# Patient Record
Sex: Male | Born: 1950 | Race: White | Hispanic: No | Marital: Married | Smoking: Never smoker
Health system: Southern US, Community
[De-identification: ages and names within clinical notes are randomized; demographics above are authoritative.]

## PROBLEM LIST (undated history)

## (undated) DIAGNOSIS — I10 Essential (primary) hypertension: Secondary | ICD-10-CM

## (undated) DIAGNOSIS — N183 Chronic kidney disease, stage 3 unspecified: Secondary | ICD-10-CM

## (undated) DIAGNOSIS — E119 Type 2 diabetes mellitus without complications: Secondary | ICD-10-CM

## (undated) DIAGNOSIS — D649 Anemia, unspecified: Secondary | ICD-10-CM

## (undated) DIAGNOSIS — E785 Hyperlipidemia, unspecified: Secondary | ICD-10-CM

## (undated) HISTORY — PX: PROSTATE BIOPSY: SHX241

## (undated) HISTORY — DX: Chronic kidney disease, stage 3 unspecified: N18.30

## (undated) HISTORY — DX: Anemia, unspecified: D64.9

## (undated) HISTORY — DX: Essential (primary) hypertension: I10

## (undated) HISTORY — DX: Type 2 diabetes mellitus without complications: E11.9

## (undated) HISTORY — PX: OTHER SURGICAL HISTORY: SHX169

## (undated) HISTORY — DX: Hyperlipidemia, unspecified: E78.5

---

## 2016-06-18 DIAGNOSIS — K219 Gastro-esophageal reflux disease without esophagitis: Secondary | ICD-10-CM | POA: Diagnosis not present

## 2016-06-18 DIAGNOSIS — I10 Essential (primary) hypertension: Secondary | ICD-10-CM | POA: Diagnosis not present

## 2016-06-18 DIAGNOSIS — Z23 Encounter for immunization: Secondary | ICD-10-CM | POA: Diagnosis not present

## 2016-06-18 DIAGNOSIS — E119 Type 2 diabetes mellitus without complications: Secondary | ICD-10-CM | POA: Diagnosis not present

## 2016-06-18 DIAGNOSIS — E7800 Pure hypercholesterolemia, unspecified: Secondary | ICD-10-CM | POA: Diagnosis not present

## 2016-10-22 ENCOUNTER — Other Ambulatory Visit (HOSPITAL_COMMUNITY): Payer: Self-pay | Admitting: Ophthalmology

## 2016-10-22 DIAGNOSIS — I6523 Occlusion and stenosis of bilateral carotid arteries: Secondary | ICD-10-CM

## 2016-11-05 ENCOUNTER — Ambulatory Visit (HOSPITAL_COMMUNITY): Admission: RE | Admit: 2016-11-05 | Payer: Self-pay | Source: Ambulatory Visit

## 2016-11-26 ENCOUNTER — Ambulatory Visit (HOSPITAL_COMMUNITY)
Admission: RE | Admit: 2016-11-26 | Discharge: 2016-11-26 | Disposition: A | Discharge status: Home / Self Care | Payer: Medicare Other | Source: Ambulatory Visit | Attending: Cardiology | Admitting: Cardiology

## 2016-11-26 DIAGNOSIS — E119 Type 2 diabetes mellitus without complications: Secondary | ICD-10-CM | POA: Diagnosis not present

## 2016-11-26 DIAGNOSIS — I10 Essential (primary) hypertension: Secondary | ICD-10-CM | POA: Insufficient documentation

## 2016-11-26 DIAGNOSIS — E785 Hyperlipidemia, unspecified: Secondary | ICD-10-CM | POA: Insufficient documentation

## 2016-11-26 DIAGNOSIS — I6523 Occlusion and stenosis of bilateral carotid arteries: Secondary | ICD-10-CM

## 2017-10-15 ENCOUNTER — Ambulatory Visit
Admission: RE | Admit: 2017-10-15 | Discharge: 2017-10-15 | Disposition: A | Discharge status: Home / Self Care | Payer: Medicare Other | Source: Ambulatory Visit | Attending: Internal Medicine | Admitting: Internal Medicine

## 2017-10-15 ENCOUNTER — Other Ambulatory Visit: Payer: Self-pay | Admitting: Internal Medicine

## 2017-10-15 DIAGNOSIS — M25562 Pain in left knee: Secondary | ICD-10-CM

## 2019-02-24 ENCOUNTER — Other Ambulatory Visit: Payer: Self-pay | Admitting: Urology

## 2019-02-24 DIAGNOSIS — C61 Malignant neoplasm of prostate: Secondary | ICD-10-CM

## 2019-06-24 ENCOUNTER — Other Ambulatory Visit: Payer: Medicare Other

## 2019-07-13 ENCOUNTER — Other Ambulatory Visit: Payer: Self-pay

## 2019-07-13 ENCOUNTER — Ambulatory Visit
Admission: RE | Admit: 2019-07-13 | Discharge: 2019-07-13 | Disposition: A | Discharge status: Home / Self Care | Payer: Medicare Other | Source: Ambulatory Visit | Attending: Urology | Admitting: Urology

## 2019-07-13 DIAGNOSIS — C61 Malignant neoplasm of prostate: Secondary | ICD-10-CM

## 2019-07-13 MED ORDER — GADOBENATE DIMEGLUMINE 529 MG/ML IV SOLN
15.0000 mL | Freq: Once | INTRAVENOUS | Status: AC | PRN
  Administered 2019-07-13: 15 mL via INTRAVENOUS

## 2020-03-11 ENCOUNTER — Other Ambulatory Visit: Payer: Self-pay | Admitting: Internal Medicine

## 2020-03-11 DIAGNOSIS — N183 Chronic kidney disease, stage 3 unspecified: Secondary | ICD-10-CM

## 2020-03-16 ENCOUNTER — Other Ambulatory Visit: Payer: Medicare Other

## 2020-03-21 ENCOUNTER — Ambulatory Visit
Admission: RE | Admit: 2020-03-21 | Discharge: 2020-03-21 | Disposition: A | Discharge status: Home / Self Care | Payer: Medicare Other | Source: Ambulatory Visit | Attending: Internal Medicine | Admitting: Internal Medicine

## 2020-03-21 DIAGNOSIS — N183 Chronic kidney disease, stage 3 unspecified: Secondary | ICD-10-CM

## 2020-09-28 DIAGNOSIS — I129 Hypertensive chronic kidney disease with stage 1 through stage 4 chronic kidney disease, or unspecified chronic kidney disease: Secondary | ICD-10-CM | POA: Diagnosis not present

## 2020-09-28 DIAGNOSIS — N2581 Secondary hyperparathyroidism of renal origin: Secondary | ICD-10-CM | POA: Diagnosis not present

## 2020-09-28 DIAGNOSIS — E872 Acidosis: Secondary | ICD-10-CM | POA: Diagnosis not present

## 2020-09-28 DIAGNOSIS — D631 Anemia in chronic kidney disease: Secondary | ICD-10-CM | POA: Diagnosis not present

## 2020-09-28 DIAGNOSIS — N1832 Chronic kidney disease, stage 3b: Secondary | ICD-10-CM | POA: Diagnosis not present

## 2020-09-28 DIAGNOSIS — R809 Proteinuria, unspecified: Secondary | ICD-10-CM | POA: Diagnosis not present

## 2021-01-03 DIAGNOSIS — Z7984 Long term (current) use of oral hypoglycemic drugs: Secondary | ICD-10-CM | POA: Diagnosis not present

## 2021-01-03 DIAGNOSIS — E1122 Type 2 diabetes mellitus with diabetic chronic kidney disease: Secondary | ICD-10-CM | POA: Diagnosis not present

## 2021-01-03 DIAGNOSIS — Z0000 Encounter for general adult medical examination without abnormal findings: Secondary | ICD-10-CM | POA: Diagnosis not present

## 2021-01-03 DIAGNOSIS — E782 Mixed hyperlipidemia: Secondary | ICD-10-CM | POA: Diagnosis not present

## 2021-01-03 DIAGNOSIS — Z1211 Encounter for screening for malignant neoplasm of colon: Secondary | ICD-10-CM | POA: Diagnosis not present

## 2021-01-03 DIAGNOSIS — Z1389 Encounter for screening for other disorder: Secondary | ICD-10-CM | POA: Diagnosis not present

## 2021-01-03 DIAGNOSIS — C029 Malignant neoplasm of tongue, unspecified: Secondary | ICD-10-CM | POA: Diagnosis not present

## 2021-01-03 DIAGNOSIS — M109 Gout, unspecified: Secondary | ICD-10-CM | POA: Diagnosis not present

## 2021-01-03 DIAGNOSIS — I10 Essential (primary) hypertension: Secondary | ICD-10-CM | POA: Diagnosis not present

## 2021-01-03 DIAGNOSIS — N183 Chronic kidney disease, stage 3 unspecified: Secondary | ICD-10-CM | POA: Diagnosis not present

## 2021-02-01 DIAGNOSIS — Z1211 Encounter for screening for malignant neoplasm of colon: Secondary | ICD-10-CM | POA: Diagnosis not present

## 2021-02-01 DIAGNOSIS — Z1212 Encounter for screening for malignant neoplasm of rectum: Secondary | ICD-10-CM | POA: Diagnosis not present

## 2021-02-09 LAB — COLOGUARD: COLOGUARD: NEGATIVE

## 2021-04-03 DIAGNOSIS — N1832 Chronic kidney disease, stage 3b: Secondary | ICD-10-CM | POA: Diagnosis not present

## 2021-04-05 DIAGNOSIS — R809 Proteinuria, unspecified: Secondary | ICD-10-CM | POA: Diagnosis not present

## 2021-04-05 DIAGNOSIS — I129 Hypertensive chronic kidney disease with stage 1 through stage 4 chronic kidney disease, or unspecified chronic kidney disease: Secondary | ICD-10-CM | POA: Diagnosis not present

## 2021-04-05 DIAGNOSIS — E872 Acidosis: Secondary | ICD-10-CM | POA: Diagnosis not present

## 2021-04-05 DIAGNOSIS — N2581 Secondary hyperparathyroidism of renal origin: Secondary | ICD-10-CM | POA: Diagnosis not present

## 2021-04-05 DIAGNOSIS — D631 Anemia in chronic kidney disease: Secondary | ICD-10-CM | POA: Diagnosis not present

## 2021-04-05 DIAGNOSIS — N1832 Chronic kidney disease, stage 3b: Secondary | ICD-10-CM | POA: Diagnosis not present

## 2021-04-25 ENCOUNTER — Ambulatory Visit
Admission: RE | Admit: 2021-04-25 | Discharge: 2021-04-25 | Disposition: A | Discharge status: Home / Self Care | Payer: Medicare Other | Source: Ambulatory Visit | Attending: Radiation Oncology | Admitting: Radiation Oncology

## 2021-04-25 DIAGNOSIS — C61 Malignant neoplasm of prostate: Secondary | ICD-10-CM

## 2021-04-25 HISTORY — DX: Malignant neoplasm of prostate: C61

## 2021-04-25 NOTE — Progress Notes (Signed)
GU Location of Tumor / Histology:  Adenocarcinoma of the prostate  If Prostate Cancer, Gleason Score is (4 + 3), PSA (7.4 as of 11/16/20), and Prostate volume (~43.5 g)  Kyle Rogers presented with signs/symptoms of: (from Dr. Ralene Muskrat note on 04/07/2021)   Biopsies revealed:  03/06/2021   06/19/2018   Past/Anticipated interventions by urology, if any:  03/06/2021 Dr. Irine Seal Transrectal ultrasound of prostate with biopsies  Past/Anticipated interventions by medical oncology, if any:  No referral placed at this time  Weight changes, if any: Patient denies  IPSS Score: 4 (mild) SHIM Score: 23 (no ED)  Bowel/Bladder complaints, if any: Patient denies any changes or new bowel/urinary concerns. Reports he would be pleased if he had to live with his current urinary condition for the rest of his life   Nausea/Vomiting, if any: Patient denies   Pain issues, if any:  Patient denies  SAFETY ISSUES: Prior radiation? No Pacemaker/ICD? No Possible current pregnancy? N/A Is the patient on methotrexate? No  Current Complaints / other details:  Patient has received the first 2 Pfizer vaccines

## 2021-04-25 NOTE — Progress Notes (Signed)
Radiation Oncology         [phone] ________________________________  Initial Outpatient Consultation - Conducted via Telephone due to current COVID-19 concerns for limiting patient exposure  Name: Kyle Rogers MRN: [id number]  Date: 04/25/2021  DOB: [birth year]  CB:[id number], [place] Ar, MD  Irine Seal, MD   REFERRING PHYSICIAN: Irine Seal, MD  DIAGNOSIS: 70 y.o. gentleman with Stage T1c adenocarcinoma of the prostate with Gleason score of 4+3, and PSA of 7.4.    ICD-10-CM   1. Malignant neoplasm of prostate ([place])  C61       HISTORY OF PRESENT ILLNESS: Kyle Rogers is a 70 y.o. male with a diagnosis of prostate cancer. He was initially diagnosed with prostate cancer in 05/2018 by Dr. Jeffie Pollock. His initial biopsy was performed for an elevated PSA of 4.42 that was elevated further at 5.27 when repeated. On biopsy, there were 3 out of 12 positive cores containing low volume Gleason 3+3 disease. He appropriately opted for active surveillance and has continued in close follow-up since that time.  His PSA was stable at 5.53 in October 2020 and he underwent surveillance prostate MRI on 07/13/19 showing no evidence of high-grade prostate carcinoma.  More recently, his PSA has shown a steady rise, at 6.64 in May 2021, 7.21 in September 2021 and reached 7.4 in 10/2020. The digital rectal examination has remained benign.  The persistent rise in PSA prompted a surveillance biopsy which was performed on 03/06/21. The prostate volume measured 43.5 cc.  Out of 12 core biopsies, 3 were positive.  The maximum Gleason score was 4+3, and this was seen in the left apex lateral. Additionally, Gleason 3+4 was seen in the left apex and right base lateral (small focus)  The patient reviewed the biopsy results with his urologist and he has kindly been referred today for discussion of potential radiation treatment options.   PREVIOUS RADIATION THERAPY: No  PAST MEDICAL HISTORY: No past medical history on file.     PAST SURGICAL HISTORY: He had a right radical neck dissection and tracheostomy with his tongue resection for carcinoma in situ.  FAMILY HISTORY: No family history on file.  SOCIAL HISTORY:  Social History   Socioeconomic History   Marital status: Married    Spouse name: Not on file   Number of children: Not on file   Years of education: Not on file   Highest education level: Not on file  Occupational History   Not on file  Tobacco Use   Smoking status: Not on file   Smokeless tobacco: Not on file  Substance and Sexual Activity   Alcohol use: Not on file   Drug use: Not on file   Sexual activity: Not on file  Other Topics Concern   Not on file  Social History Narrative   Not on file   Social Determinants of Health   Financial Resource Strain: Not on file  Food Insecurity: Not on file  Transportation Needs: Not on file  Physical Activity: Not on file  Stress: Not on file  Social Connections: Not on file  Intimate Partner Violence: Not on file    ALLERGIES: Patient has no known allergies.  MEDICATIONS:  Current Outpatient Medications  Medication Sig Dispense Refill   allopurinol (ZYLOPRIM) 100 MG tablet Take 100 mg by mouth daily.     JARDIANCE 10 MG TABS tablet Take 10 mg by mouth daily.     lisinopril (ZESTRIL) 40 MG tablet Take 40 mg by mouth daily.  metFORMIN (GLUCOPHAGE) 1000 MG tablet Take 1,000 mg by mouth daily.     simvastatin (ZOCOR) 40 MG tablet Take 40 mg by mouth at bedtime.     sodium bicarbonate 650 MG tablet Take 650 mg by mouth 3 (three) times daily.     No current facility-administered medications for this encounter.    REVIEW OF SYSTEMS:  On review of systems, the patient reports that he is doing well overall. He denies any chest pain, shortness of breath, cough, fevers, chills, night sweats, unintended weight changes. He denies any bowel disturbances, and denies abdominal pain, nausea or vomiting. He denies any new musculoskeletal or joint  aches or pains. His IPSS was 4, indicating mild urinary symptoms. His SHIM was 23, indicating he does not have erectile dysfunction. A complete review of systems is obtained and is otherwise negative.    PHYSICAL EXAM:  Wt Readings from Last 3 Encounters:  No data found for Wt   Temp Readings from Last 3 Encounters:  No data found for Temp   BP Readings from Last 3 Encounters:  No data found for BP   Pulse Readings from Last 3 Encounters:  No data found for Pulse    /10  Physical exam not performed in light of telephone encounter.   KPS = 90  100 - Normal; no complaints; no evidence of disease. 90   - Able to carry on normal activity; minor signs or symptoms of disease. 80   - Normal activity with effort; some signs or symptoms of disease. 32   - Cares for self; unable to carry on normal activity or to do active work. 60   - Requires occasional assistance, but is able to care for most of his personal needs. 50   - Requires considerable assistance and frequent medical care. 33   - Disabled; requires special care and assistance. 71   - Severely disabled; hospital admission is indicated although death not imminent. 64   - Very sick; hospital admission necessary; active supportive treatment necessary. 10   - Moribund; fatal processes progressing rapidly. 0     - Dead  Karnofsky DA, Abelmann WH, Craver LS and Burchenal JH [phone]) The use of the nitrogen mustards in the palliative treatment of carcinoma: with particular reference to bronchogenic carcinoma Cancer 1 634-56  LABORATORY DATA:  No results found for: WBC, HGB, HCT, MCV, PLT No results found for: NA, K, CL, CO2 No results found for: ALT, AST, GGT, ALKPHOS, BILITOT   RADIOGRAPHY: No results found.    IMPRESSION/PLAN: This visit was conducted via Telephone to spare the patient unnecessary potential exposure in the healthcare setting during the current COVID-19 pandemic. 1. 70 y.o. gentleman with Stage T1c adenocarcinoma  of the prostate with Gleason Score of 4+3, and PSA of 7.4. We discussed the patient's workup and outlined the nature of prostate cancer in this setting. The patient's T stage, Gleason's score, and PSA put him into the unfavorable intermediate risk group. Accordingly, he is eligible for a variety of potential treatment options including brachytherapy, 5.5 weeks of external radiation, or prostatectomy. We discussed the available radiation techniques, and focused on the details and logistics of delivery.  We discussed that while brachytherapy is an option, it is not guideline recommended treatment for unfavorable risk Gleason 4+3 disease.  However, this could be offered at our facility given our high-volume and extensive experience with excellent outcomes on data that we have collected on our patients treated with brachytherapy alone for select patients  with low volume Gleason 4+3 prostate cancer.  We discussed and outlined the risks, benefits, short and long-term effects associated with radiotherapy and compared and contrasted these with prostatectomy. We discussed the role of SpaceOAR in reducing the rectal toxicity associated with radiotherapy. He appears to have a good understanding of his disease and our treatment recommendations which are of curative intent.  He was encouraged to ask questions that were answered to his stated satisfaction.  At the end of the conversation the patient remains undecided regarding his treatment preference and would like to take some additional time to consider his options prior to making a final decision.  He has our contact information and will let us know if he ultimately decides to proceed with radiotherapy. If he elects to proceed with external beam radiation, we will refer him to [facility] as he prefers to receive daily treatments closer to home.  We will plan to follow-up with him by phone if we have not heard from him in the next 2 weeks and we will proceed with  treatment planning accordingly.  We enjoyed meeting him today and look forward to continuing to participate in his care.  Given current concerns for patient exposure during the COVID-19 pandemic, this encounter was conducted via telephone. The patient was notified in advance and was offered a MyChart meeting to allow for face to face communication but unfortunately reported that he did not have the appropriate resources/technology to support such a visit and instead preferred to proceed with telephone consult. The patient has given verbal consent for this type of encounter. The attendants for this meeting include Tyler Pita MD, Ashlyn Bruning PA-C, [place], and patient Kyle Rogers. During the encounter, Tyler Pita MD, Ashlyn Bruning PA-C, and scribe, Wilburn Mylar were located at [place].  Patient Kyle Rogers was located at home.   We personally spent 70 minutes in this encounter including chart review, reviewing radiological studies, meeting face-to-face with the patient, entering orders and completing documentation.    Nicholos Johns, PA-C    Tyler Pita, MD  [facility] Direct Dial: [phone]  Fax: [phone] [email or web address]  Skype  [email or web address]   This document serves as a record of services personally performed by Tyler Pita, MD and Freeman Caldron, PA-C. It was created on their behalf by Wilburn Mylar, a trained medical scribe. The creation of this record is based on the scribe's personal observations and the provider's statements to them. This document has been checked and approved by the attending provider.

## 2021-05-03 ENCOUNTER — Other Ambulatory Visit: Payer: Self-pay | Admitting: Urology

## 2021-05-03 DIAGNOSIS — C61 Malignant neoplasm of prostate: Secondary | ICD-10-CM

## 2021-05-09 ENCOUNTER — Telehealth: Payer: Self-pay | Admitting: *Deleted

## 2021-05-09 NOTE — Telephone Encounter (Signed)
RECEIVED PHONE CALL FROM [facility] AND THIS PATIENT HAS AN APPT. FOR CONSULTATION FOR RADIATION ONCOLOGY ON 05-11-21 @ 2 PM, AND THE PATIENT IS AWARE OF THIS APPT.

## 2021-05-11 DIAGNOSIS — N183 Chronic kidney disease, stage 3 unspecified: Secondary | ICD-10-CM | POA: Diagnosis not present

## 2021-05-11 DIAGNOSIS — I10 Essential (primary) hypertension: Secondary | ICD-10-CM | POA: Diagnosis not present

## 2021-05-11 DIAGNOSIS — E119 Type 2 diabetes mellitus without complications: Secondary | ICD-10-CM | POA: Diagnosis not present

## 2021-05-11 DIAGNOSIS — E7800 Pure hypercholesterolemia, unspecified: Secondary | ICD-10-CM | POA: Diagnosis not present

## 2021-05-11 DIAGNOSIS — Z7984 Long term (current) use of oral hypoglycemic drugs: Secondary | ICD-10-CM | POA: Diagnosis not present

## 2021-05-12 DIAGNOSIS — N1832 Chronic kidney disease, stage 3b: Secondary | ICD-10-CM | POA: Diagnosis not present

## 2021-05-26 DIAGNOSIS — I739 Peripheral vascular disease, unspecified: Secondary | ICD-10-CM | POA: Diagnosis not present

## 2021-05-26 DIAGNOSIS — R590 Localized enlarged lymph nodes: Secondary | ICD-10-CM | POA: Diagnosis not present

## 2021-05-26 DIAGNOSIS — M47816 Spondylosis without myelopathy or radiculopathy, lumbar region: Secondary | ICD-10-CM | POA: Diagnosis not present

## 2021-06-27 DIAGNOSIS — I10 Essential (primary) hypertension: Secondary | ICD-10-CM | POA: Diagnosis not present

## 2021-06-27 DIAGNOSIS — Z23 Encounter for immunization: Secondary | ICD-10-CM | POA: Diagnosis not present

## 2021-06-27 DIAGNOSIS — N183 Chronic kidney disease, stage 3 unspecified: Secondary | ICD-10-CM | POA: Diagnosis not present

## 2021-06-27 DIAGNOSIS — E1122 Type 2 diabetes mellitus with diabetic chronic kidney disease: Secondary | ICD-10-CM | POA: Diagnosis not present

## 2021-06-27 DIAGNOSIS — M109 Gout, unspecified: Secondary | ICD-10-CM | POA: Diagnosis not present

## 2021-07-06 DIAGNOSIS — N1832 Chronic kidney disease, stage 3b: Secondary | ICD-10-CM | POA: Diagnosis not present

## 2021-08-14 DIAGNOSIS — N1832 Chronic kidney disease, stage 3b: Secondary | ICD-10-CM | POA: Diagnosis not present

## 2021-09-19 DIAGNOSIS — N1832 Chronic kidney disease, stage 3b: Secondary | ICD-10-CM | POA: Diagnosis not present

## 2021-09-27 DIAGNOSIS — E872 Acidosis, unspecified: Secondary | ICD-10-CM | POA: Diagnosis not present

## 2021-09-27 DIAGNOSIS — N1832 Chronic kidney disease, stage 3b: Secondary | ICD-10-CM | POA: Diagnosis not present

## 2021-09-27 DIAGNOSIS — R809 Proteinuria, unspecified: Secondary | ICD-10-CM | POA: Diagnosis not present

## 2021-09-27 DIAGNOSIS — N2581 Secondary hyperparathyroidism of renal origin: Secondary | ICD-10-CM | POA: Diagnosis not present

## 2021-09-27 DIAGNOSIS — I129 Hypertensive chronic kidney disease with stage 1 through stage 4 chronic kidney disease, or unspecified chronic kidney disease: Secondary | ICD-10-CM | POA: Diagnosis not present

## 2021-09-27 DIAGNOSIS — D631 Anemia in chronic kidney disease: Secondary | ICD-10-CM | POA: Diagnosis not present

## 2021-09-27 DIAGNOSIS — E785 Hyperlipidemia, unspecified: Secondary | ICD-10-CM | POA: Diagnosis not present

## 2021-10-24 DIAGNOSIS — N1832 Chronic kidney disease, stage 3b: Secondary | ICD-10-CM | POA: Diagnosis not present

## 2021-11-10 DIAGNOSIS — N1832 Chronic kidney disease, stage 3b: Secondary | ICD-10-CM | POA: Diagnosis not present

## 2022-01-09 DIAGNOSIS — N1832 Chronic kidney disease, stage 3b: Secondary | ICD-10-CM | POA: Diagnosis not present

## 2022-01-09 DIAGNOSIS — I10 Essential (primary) hypertension: Secondary | ICD-10-CM | POA: Diagnosis not present

## 2022-01-09 DIAGNOSIS — E7800 Pure hypercholesterolemia, unspecified: Secondary | ICD-10-CM | POA: Diagnosis not present

## 2022-01-09 DIAGNOSIS — M109 Gout, unspecified: Secondary | ICD-10-CM | POA: Diagnosis not present

## 2022-01-09 DIAGNOSIS — C029 Malignant neoplasm of tongue, unspecified: Secondary | ICD-10-CM | POA: Diagnosis not present

## 2022-01-09 DIAGNOSIS — Z0000 Encounter for general adult medical examination without abnormal findings: Secondary | ICD-10-CM | POA: Diagnosis not present

## 2022-01-09 DIAGNOSIS — Z1389 Encounter for screening for other disorder: Secondary | ICD-10-CM | POA: Diagnosis not present

## 2022-01-09 DIAGNOSIS — E1122 Type 2 diabetes mellitus with diabetic chronic kidney disease: Secondary | ICD-10-CM | POA: Diagnosis not present

## 2022-02-09 DIAGNOSIS — D696 Thrombocytopenia, unspecified: Secondary | ICD-10-CM | POA: Diagnosis not present

## 2022-02-16 ENCOUNTER — Telehealth: Payer: Self-pay | Admitting: Physician Assistant

## 2022-02-16 NOTE — Telephone Encounter (Signed)
Scheduled appt per 5/26 referral. Pt is aware of appt date and time. Pt is aware to arrive 15 mins prior to appt time and to bring and updated insurance card. Pt is aware of appt location.   

## 2022-02-28 NOTE — Progress Notes (Deleted)
[place] Telephone:([phone]) [phone]   Fax:([phone]) [phone]  CONSULT NOTE  REFERRING PHYSICIAN: Dr. Lysle Rubens  REASON FOR CONSULTATION:  Thrombocytopenia  HPI Kyle Rogers is a 71 y.o. male with a past medical history significant for prostate cancer (Dx 05/2018 followed by Dr. Tammi Klippel from [place] and Dr. Jeffie Pollock from urology), chronic kidney disease, squamous cell carcinoma of the tongue, gout, osteoarthritis, hypertension, GERD, diabetes, hyperlipidemia, and ***is referred to the clinic for thrombocytopenia.  The patient had a visit with his primary care provider in May 2023.  He had labs performed on 02/13/2022 which showed normal white blood cell count of 4.5, normal hemoglobin?  11.2 and slightly low platelet count at 133 K.  He was referred to clinic for further evaluation regarding these findings.  I do not have any prior labs to compare this to.  To the patient's knowledge he is not sure how long he has had mild thrombocytopenia.  The patient denies any recent illness. He denies frequent infections.  He denies taking any over-the-counter or herbal supplements except for ***.    The patient denies any history of nutrient deficiencies aside from ***.  The patient denies any history of liver disease.  The patient denies any fevers, abdominal pain, bloating, early satiety, lymphadenopathy, unexplained weight loss, or night sweats. He reports possible fatigue ***.  The patient denies any history of any autoimmune disorders.  He denies any history of HIV, hepatitis, or mono. The patient denies any congenital syndromes.  She denies any surgeries and bleeding complications?  The patient denies any recent epistaxis, gingival bleeding, hematemesis, epistaxis, hematuria, hemoptysis, hematemesis or petechiae. He denies any excessive bleeding although ***bruising. He denies any new medication use.  He denies any prior history of mono, varicella, mumps, or rubella.   She reports her alcohol  intake is *** drinks per month.  The patient denies ever needing any type of blood transfusions or platelet transfusions.  He denies any recent vaccines as his last vaccine was for COVID-19. He denies any history of tick exposures.  Denies any history of bariatric surgeries. Denies any clots.  She denies any thyroid issues.    HPI  No past medical history on file.  *** The histories are not reviewed yet. Please review them in the "History" navigator section and refresh this Oriental.  No family history on file.  Social History    No Known Allergies  Current Outpatient Medications  Medication Sig Dispense Refill   allopurinol (ZYLOPRIM) 100 MG tablet Take 100 mg by mouth daily.     JARDIANCE 10 MG TABS tablet Take 10 mg by mouth daily.     lisinopril (ZESTRIL) 40 MG tablet Take 40 mg by mouth daily.     metFORMIN (GLUCOPHAGE) 1000 MG tablet Take 1,000 mg by mouth daily.     simvastatin (ZOCOR) 40 MG tablet Take 40 mg by mouth at bedtime.     sodium bicarbonate 650 MG tablet Take 650 mg by mouth 3 (three) times daily.     No current facility-administered medications for this visit.    REVIEW OF SYSTEMS:   Review of Systems  Constitutional: Negative for appetite change, chills, fatigue, fever and unexpected weight change.  HENT:   Negative for mouth sores, nosebleeds, sore throat and trouble swallowing.   Eyes: Negative for eye problems and icterus.  Respiratory: Negative for cough, hemoptysis, shortness of breath and wheezing.   Cardiovascular: Negative for chest pain and leg swelling.  Gastrointestinal: Negative for  abdominal pain, constipation, diarrhea, nausea and vomiting.  Genitourinary: Negative for bladder incontinence, difficulty urinating, dysuria, frequency and hematuria.   Musculoskeletal: Negative for back pain, gait problem, neck pain and neck stiffness.  Skin: Negative for itching and rash.  Neurological: Negative for dizziness, extremity weakness, gait problem,  headaches, light-headedness and seizures.  Hematological: Negative for adenopathy. Does not bruise/bleed easily.  Psychiatric/Behavioral: Negative for confusion, depression and sleep disturbance. The patient is not nervous/anxious.     PHYSICAL EXAMINATION:  There were no vitals taken for this visit.  ECOG PERFORMANCE STATUS: {CHL ONC ECOG [id number]  Physical Exam  Constitutional: Oriented to person, place, and time and well-developed, well-nourished, and in no distress. No distress.  HENT:  Head: Normocephalic and atraumatic.  Mouth/Throat: Oropharynx is clear and moist. No oropharyngeal exudate.  Eyes: Conjunctivae are normal. Right eye exhibits no discharge. Left eye exhibits no discharge. No scleral icterus.  Neck: Normal range of motion. Neck supple.  Cardiovascular: Normal rate, regular rhythm, normal heart sounds and intact distal pulses.   Pulmonary/Chest: Effort normal and breath sounds normal. No respiratory distress. No wheezes. No rales.  Abdominal: Soft. Bowel sounds are normal. Exhibits no distension and no mass. There is no tenderness.  Musculoskeletal: Normal range of motion. Exhibits no edema.  Lymphadenopathy:    No cervical adenopathy.  Neurological: Alert and oriented to person, place, and time. Exhibits normal muscle tone. Gait normal. Coordination normal.  Skin: Skin is warm and dry. No rash noted. Not diaphoretic. No erythema. No pallor.  Psychiatric: Mood, memory and judgment normal.  Vitals reviewed.  LABORATORY DATA: No results found for: WBC, HGB, HCT, MCV, PLT    Chemistry   No results found for: NA, K, CL, CO2, BUN, CREATININE, GLU No results found for: CALCIUM, ALKPHOS, AST, ALT, BILITOT     RADIOGRAPHIC STUDIES: No results found.  ASSESSMENT: This is a very pleasant 71 year old Caucasian male referred to the clinic for thrombocytopenia.  Patient was seen with Dr. Julien Nordmann today.  The patient several lab studies performed including CBC, CMP,  LDH, folate, vitamin B12, acute hepatitis panel, HIV testing, rheumatoid factor testing, ANA testing, and TSH.    Thus far, the patient CBC shows thrombocytopenia with a platelet count of ***k. No anemia so likely no hemolysis. His WBC is WNL. Her CMP is unremarkable except for ***.    His B12, folate, and HIV testing are negative. His other lab studies are pending at this time.    Dr. Julien Nordmann believes this is likely secondary to *** but this is a diagnosis of exclusion. We will wait for the testing to results before arranging for further testing.     The patient voices understanding of current disease status and treatment options and is in agreement with the current care plan.  All questions were answered. The patient knows to call the clinic with any problems, questions or concerns. We can certainly see the patient much sooner if necessary.  Thank you so much for allowing me to participate in the care of Kyle Rogers. I will continue to follow up the patient with you and assist in his care.  I spent {CHL ONC TIME VISIT - MVEHM:[id number]} counseling the patient face to face. The total time spent in the appointment was {CHL ONC TIME VISIT - MOQHU:[id number]}.  Disclaimer: This note was dictated with voice recognition software. Similar sounding words can inadvertently be transcribed and may not be corrected upon review.   Harper Smoker L Boden Stucky February 28, 2022, 1:10 PM

## 2022-03-05 ENCOUNTER — Other Ambulatory Visit: Payer: Self-pay | Admitting: Physician Assistant

## 2022-03-05 DIAGNOSIS — D696 Thrombocytopenia, unspecified: Secondary | ICD-10-CM

## 2022-03-06 ENCOUNTER — Telehealth: Payer: Self-pay | Admitting: Hematology and Oncology

## 2022-03-06 ENCOUNTER — Inpatient Hospital Stay: Payer: Medicare Other | Admitting: Physician Assistant

## 2022-03-06 ENCOUNTER — Inpatient Hospital Stay: Payer: Medicare Other

## 2022-03-06 NOTE — Telephone Encounter (Signed)
Pt called in stating he was having car trouble and needed to r/s his appt. R/s appt. Pt is aware of new appt date and time.

## 2022-03-12 DIAGNOSIS — E119 Type 2 diabetes mellitus without complications: Secondary | ICD-10-CM | POA: Diagnosis not present

## 2022-03-19 ENCOUNTER — Encounter: Payer: Self-pay | Admitting: Hematology and Oncology

## 2022-03-19 ENCOUNTER — Inpatient Hospital Stay: Payer: Medicare Other | Attending: Physician Assistant | Admitting: Hematology and Oncology

## 2022-03-19 ENCOUNTER — Inpatient Hospital Stay: Payer: Medicare Other

## 2022-03-19 VITALS — BP 146/72 | HR 55 | Temp 98.6°F | Resp 18 | Wt 152.8 lb

## 2022-03-19 DIAGNOSIS — Z85810 Personal history of malignant neoplasm of tongue: Secondary | ICD-10-CM

## 2022-03-19 DIAGNOSIS — C61 Malignant neoplasm of prostate: Secondary | ICD-10-CM

## 2022-03-19 DIAGNOSIS — N183 Chronic kidney disease, stage 3 unspecified: Secondary | ICD-10-CM

## 2022-03-19 DIAGNOSIS — Z8619 Personal history of other infectious and parasitic diseases: Secondary | ICD-10-CM

## 2022-03-19 DIAGNOSIS — D649 Anemia, unspecified: Secondary | ICD-10-CM | POA: Diagnosis not present

## 2022-03-19 DIAGNOSIS — D696 Thrombocytopenia, unspecified: Secondary | ICD-10-CM

## 2022-03-19 HISTORY — DX: Personal history of other infectious and parasitic diseases: Z86.19

## 2022-03-19 HISTORY — DX: Thrombocytopenia, unspecified: D69.6

## 2022-03-19 HISTORY — DX: Personal history of malignant neoplasm of tongue: Z85.810

## 2022-03-19 LAB — CBC WITH DIFFERENTIAL (CANCER CENTER ONLY)
Abs Immature Granulocytes: 0.05 10*3/uL (ref 0.00–0.07)
Basophils Absolute: 0 10*3/uL (ref 0.0–0.1)
Basophils Relative: 0 %
Eosinophils Absolute: 0.1 10*3/uL (ref 0.0–0.5)
Eosinophils Relative: 2 %
HCT: 29.8 % — ABNORMAL LOW (ref 39.0–52.0)
Hemoglobin: 10.6 g/dL — ABNORMAL LOW (ref 13.0–17.0)
Immature Granulocytes: 1 %
Lymphocytes Relative: 17 %
Lymphs Abs: 1 10*3/uL (ref 0.7–4.0)
MCH: 33.8 pg (ref 26.0–34.0)
MCHC: 35.6 g/dL (ref 30.0–36.0)
MCV: 94.9 fL (ref 80.0–100.0)
Monocytes Absolute: 0.4 10*3/uL (ref 0.1–1.0)
Monocytes Relative: 8 %
Neutro Abs: 4.1 10*3/uL (ref 1.7–7.7)
Neutrophils Relative %: 72 %
Platelet Count: 131 10*3/uL — ABNORMAL LOW (ref 150–400)
RBC: 3.14 MIL/uL — ABNORMAL LOW (ref 4.22–5.81)
RDW: 13.8 % (ref 11.5–15.5)
WBC Count: 5.7 10*3/uL (ref 4.0–10.5)
nRBC: 0.3 % — ABNORMAL HIGH (ref 0.0–0.2)

## 2022-03-19 LAB — VITAMIN B12: Vitamin B-12: 300 pg/mL (ref 180–914)

## 2022-03-19 LAB — ABO/RH: ABO/RH(D): B POS

## 2022-03-19 LAB — HEPATITIS C ANTIBODY: HCV Ab: NONREACTIVE

## 2022-03-19 LAB — HIV ANTIBODY (ROUTINE TESTING W REFLEX): HIV Screen 4th Generation wRfx: NONREACTIVE

## 2022-03-20 ENCOUNTER — Encounter: Payer: Self-pay | Admitting: Hematology and Oncology

## 2022-03-20 DIAGNOSIS — N183 Chronic kidney disease, stage 3 unspecified: Secondary | ICD-10-CM | POA: Insufficient documentation

## 2022-03-20 DIAGNOSIS — D649 Anemia, unspecified: Secondary | ICD-10-CM | POA: Insufficient documentation

## 2022-03-20 NOTE — Assessment & Plan Note (Signed)
He is undergoing close monitoring and follow-up between radiation oncologist and his urologist

## 2022-04-06 ENCOUNTER — Telehealth: Payer: Self-pay | Admitting: Hematology and Oncology

## 2022-04-06 NOTE — Telephone Encounter (Signed)
.  Called patient to schedule appointment per 6/28 inbasket, patient is aware of date and time.   

## 2022-05-15 DIAGNOSIS — D631 Anemia in chronic kidney disease: Secondary | ICD-10-CM | POA: Diagnosis not present

## 2022-05-15 DIAGNOSIS — I129 Hypertensive chronic kidney disease with stage 1 through stage 4 chronic kidney disease, or unspecified chronic kidney disease: Secondary | ICD-10-CM | POA: Diagnosis not present

## 2022-05-15 DIAGNOSIS — N2581 Secondary hyperparathyroidism of renal origin: Secondary | ICD-10-CM | POA: Diagnosis not present

## 2022-05-15 DIAGNOSIS — R809 Proteinuria, unspecified: Secondary | ICD-10-CM | POA: Diagnosis not present

## 2022-05-15 DIAGNOSIS — E872 Acidosis, unspecified: Secondary | ICD-10-CM | POA: Diagnosis not present

## 2022-05-15 DIAGNOSIS — N1832 Chronic kidney disease, stage 3b: Secondary | ICD-10-CM | POA: Diagnosis not present

## 2022-05-21 IMAGING — US US RENAL
1 series · 14 of 25 positions shown · non-contrast
Comparison: None.

CLINICAL DATA: Stage 3 chronic kidney disease.

EXAM:
RENAL / URINARY TRACT ULTRASOUND COMPLETE

[Series 1: us renal · 0.25mm/px · 14 of 45 slices shown]
[im 1/45]
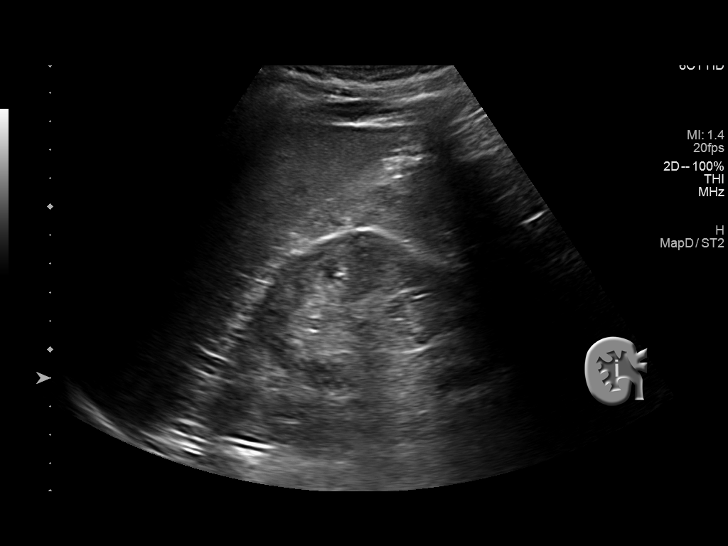
[im 4/45]
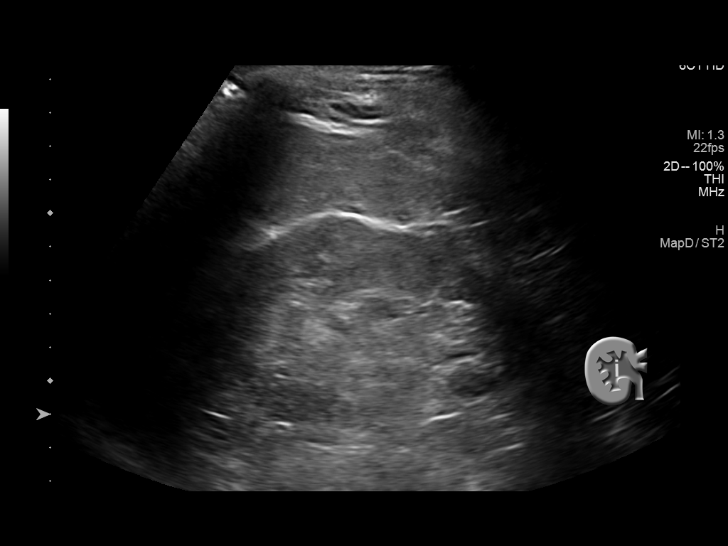
[im 8/45]
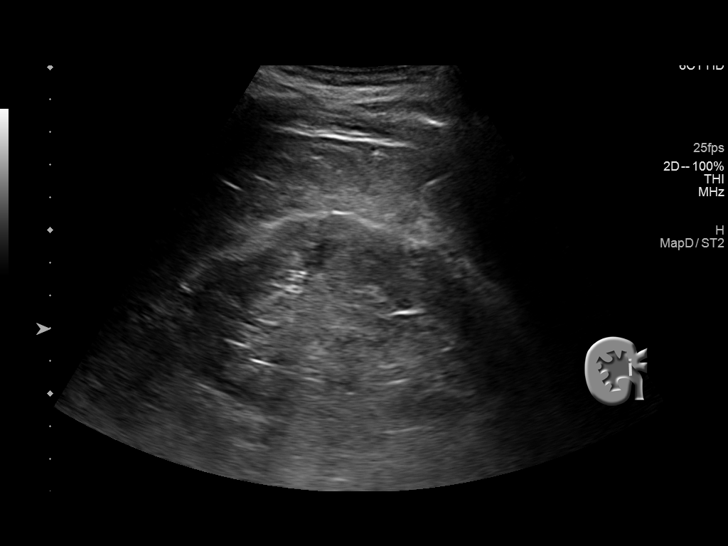
[im 12/45]
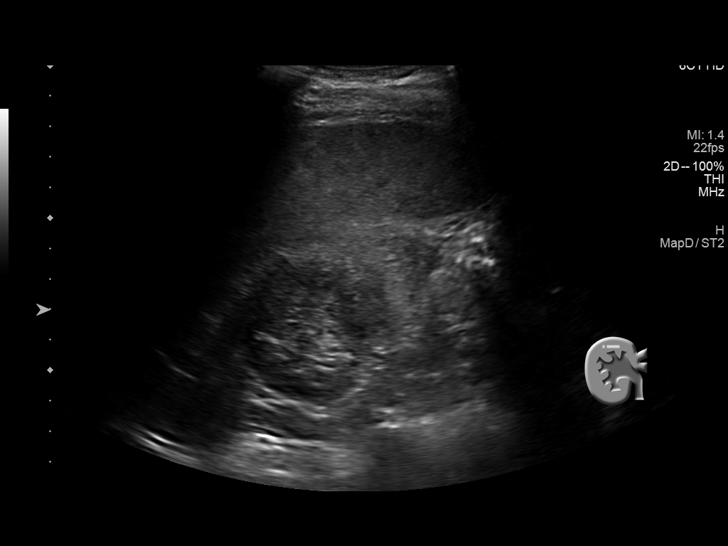
[im 15/45]
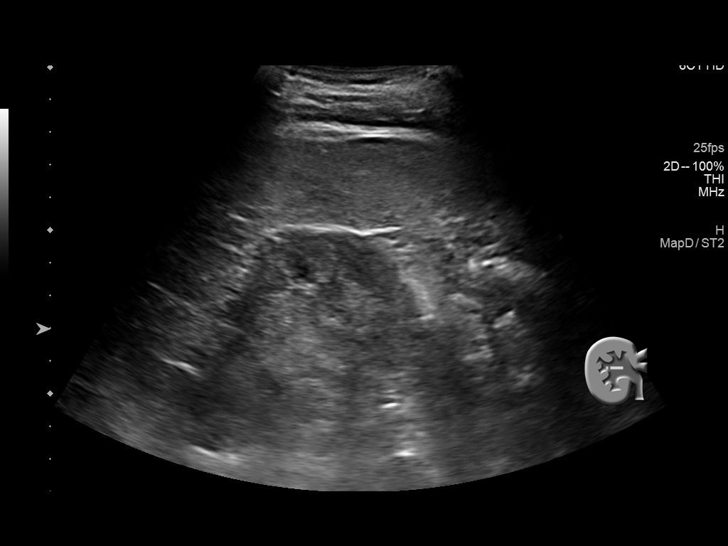
[im 17/45]
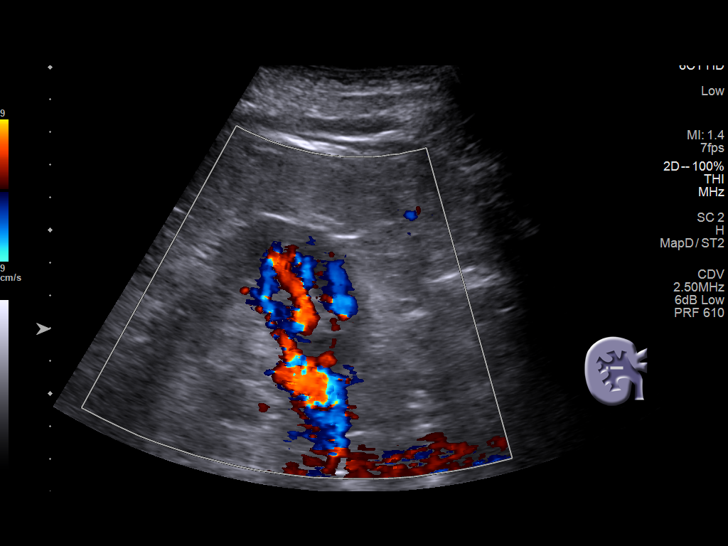
[im 21/45]
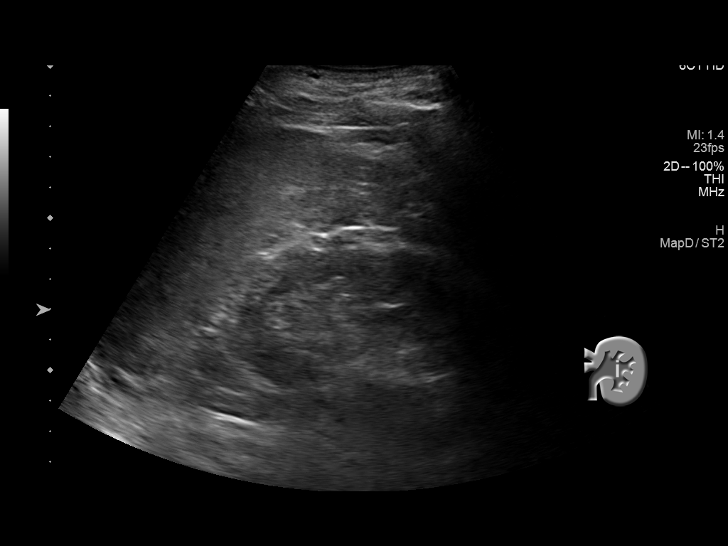
[im 24/45]
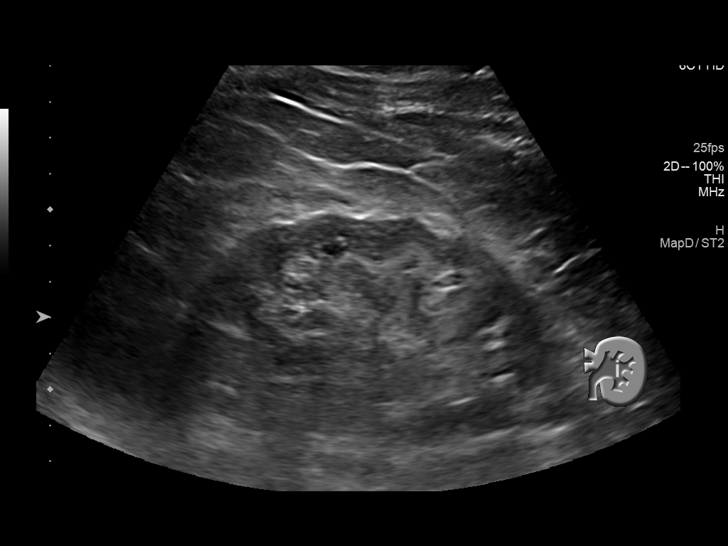
[im 28/45]
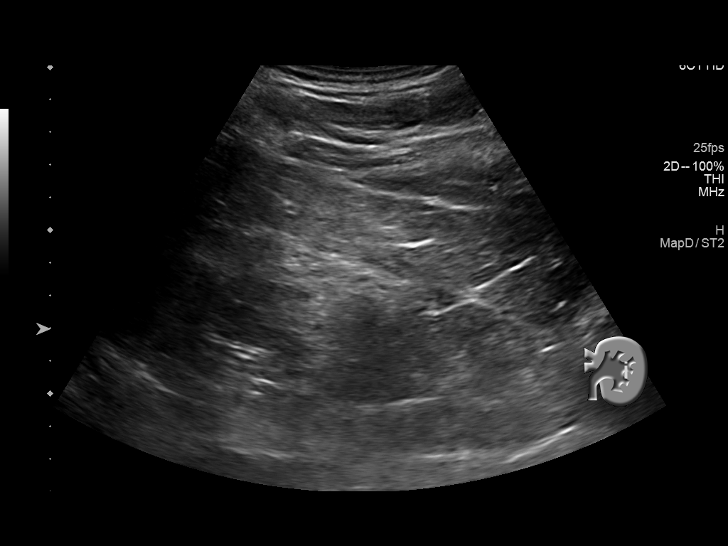
[im 30/45]
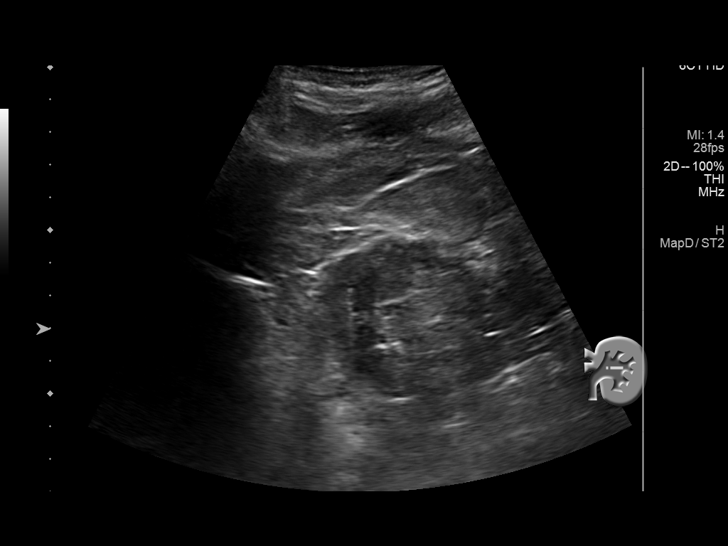
[im 34/45]
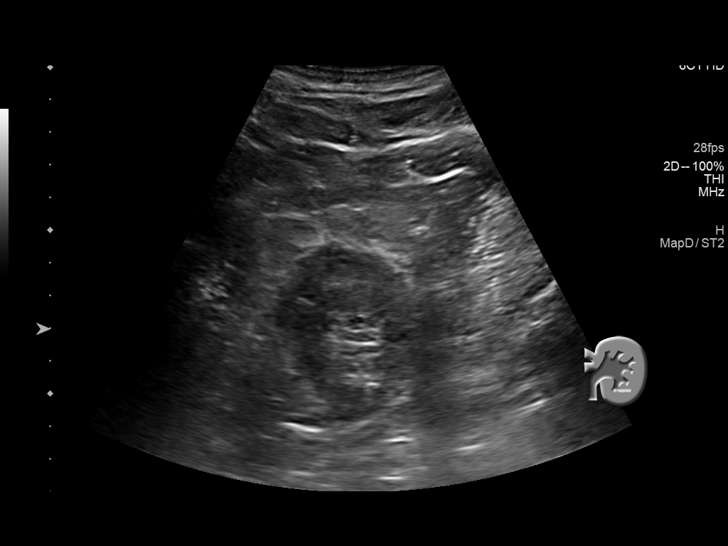
[im 37/45]
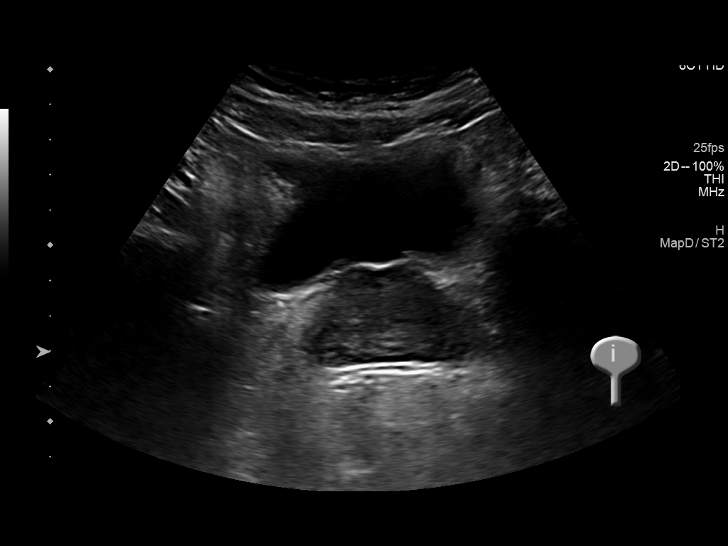
[im 41/45]
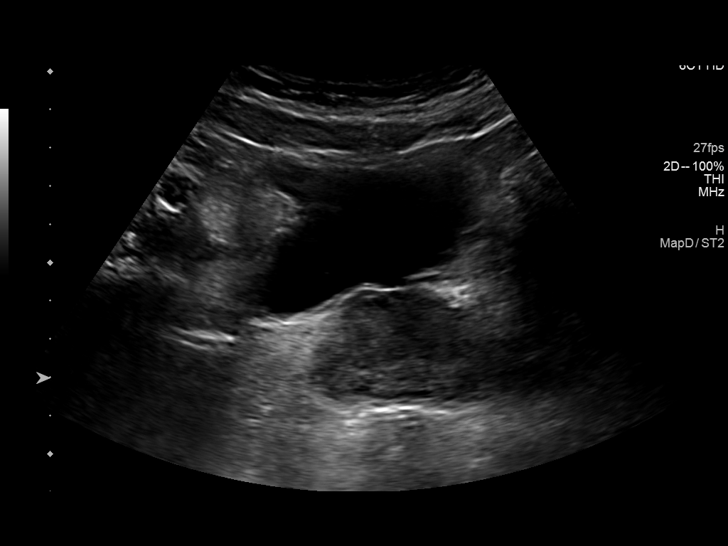
[im 45/45]
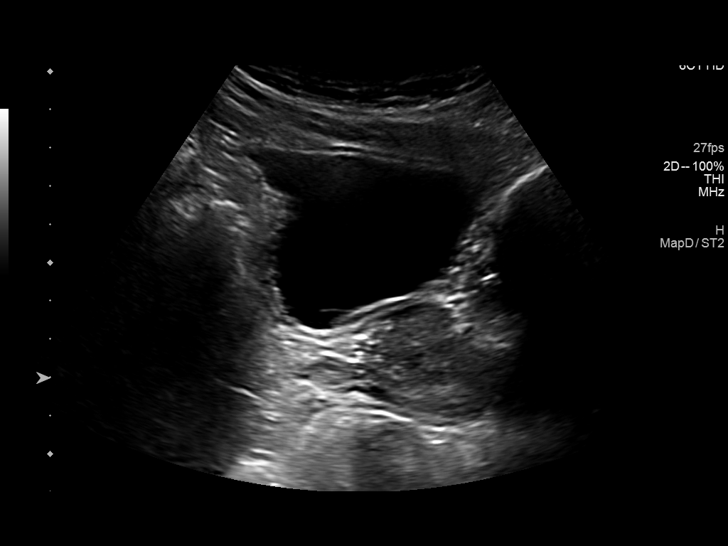

[14 of 25 positions shown; findings below may reference images not displayed]

FINDINGS: Right Kidney:

Renal measurements: 9.8 x 6.6 x 5.7 cm = volume: 193 mL. No mass or
hydronephrosis visualized. Diffuse slight thinning and slight
increased echogenicity of the renal parenchyma.

Left Kidney:

Renal measurements: 9.0 x 4.7 x 5.5 cm = volume: 120 mL. Diffuse
thinning of the renal cortex with slight increased echogenicity of
the parenchyma. No hydronephrosis. No mass.

Bladder:

Appears normal for degree of bladder distention. Prostate gland is
slightly prominent at 4.6 x 4.0 x 3.2 cm

Other:

None.
IMPRESSION: Increased echogenicity and thinning of the renal cortices
bilaterally consistent with renal medical disease.

## 2022-07-17 DIAGNOSIS — I10 Essential (primary) hypertension: Secondary | ICD-10-CM | POA: Diagnosis not present

## 2022-07-17 DIAGNOSIS — C029 Malignant neoplasm of tongue, unspecified: Secondary | ICD-10-CM | POA: Diagnosis not present

## 2022-07-17 DIAGNOSIS — M109 Gout, unspecified: Secondary | ICD-10-CM | POA: Diagnosis not present

## 2022-07-17 DIAGNOSIS — E782 Mixed hyperlipidemia: Secondary | ICD-10-CM | POA: Diagnosis not present

## 2022-07-17 DIAGNOSIS — Z23 Encounter for immunization: Secondary | ICD-10-CM | POA: Diagnosis not present

## 2022-07-17 DIAGNOSIS — E1122 Type 2 diabetes mellitus with diabetic chronic kidney disease: Secondary | ICD-10-CM | POA: Diagnosis not present

## 2022-07-17 DIAGNOSIS — N1832 Chronic kidney disease, stage 3b: Secondary | ICD-10-CM | POA: Diagnosis not present

## 2022-08-24 ENCOUNTER — Inpatient Hospital Stay (HOSPITAL_COMMUNITY)
Admission: EM | Admit: 2022-08-24 | Discharge: 2022-08-28 | DRG: 854 | Disposition: A | Discharge status: Home / Self Care | Payer: Medicare Other | Attending: Internal Medicine | Admitting: Internal Medicine

## 2022-08-24 ENCOUNTER — Emergency Department (HOSPITAL_COMMUNITY): Payer: Medicare Other

## 2022-08-24 ENCOUNTER — Encounter (HOSPITAL_COMMUNITY): Payer: Self-pay | Admitting: *Deleted

## 2022-08-24 ENCOUNTER — Other Ambulatory Visit: Payer: Self-pay

## 2022-08-24 DIAGNOSIS — R6520 Severe sepsis without septic shock: Secondary | ICD-10-CM | POA: Diagnosis not present

## 2022-08-24 DIAGNOSIS — B9620 Unspecified Escherichia coli [E. coli] as the cause of diseases classified elsewhere: Secondary | ICD-10-CM | POA: Diagnosis not present

## 2022-08-24 DIAGNOSIS — Z79899 Other long term (current) drug therapy: Secondary | ICD-10-CM | POA: Diagnosis not present

## 2022-08-24 DIAGNOSIS — K831 Obstruction of bile duct: Secondary | ICD-10-CM | POA: Diagnosis not present

## 2022-08-24 DIAGNOSIS — R7989 Other specified abnormal findings of blood chemistry: Secondary | ICD-10-CM | POA: Diagnosis not present

## 2022-08-24 DIAGNOSIS — D696 Thrombocytopenia, unspecified: Secondary | ICD-10-CM | POA: Diagnosis present

## 2022-08-24 DIAGNOSIS — N179 Acute kidney failure, unspecified: Secondary | ICD-10-CM | POA: Diagnosis present

## 2022-08-24 DIAGNOSIS — R7401 Elevation of levels of liver transaminase levels: Secondary | ICD-10-CM | POA: Diagnosis not present

## 2022-08-24 DIAGNOSIS — D631 Anemia in chronic kidney disease: Secondary | ICD-10-CM | POA: Diagnosis not present

## 2022-08-24 DIAGNOSIS — K828 Other specified diseases of gallbladder: Secondary | ICD-10-CM | POA: Diagnosis not present

## 2022-08-24 DIAGNOSIS — N184 Chronic kidney disease, stage 4 (severe): Secondary | ICD-10-CM | POA: Diagnosis not present

## 2022-08-24 DIAGNOSIS — Z79890 Hormone replacement therapy (postmenopausal): Secondary | ICD-10-CM

## 2022-08-24 DIAGNOSIS — K838 Other specified diseases of biliary tract: Secondary | ICD-10-CM | POA: Diagnosis not present

## 2022-08-24 DIAGNOSIS — A419 Sepsis, unspecified organism: Secondary | ICD-10-CM

## 2022-08-24 DIAGNOSIS — K8042 Calculus of bile duct with acute cholecystitis without obstruction: Secondary | ICD-10-CM | POA: Diagnosis not present

## 2022-08-24 DIAGNOSIS — K8309 Other cholangitis: Secondary | ICD-10-CM | POA: Diagnosis not present

## 2022-08-24 DIAGNOSIS — Z8619 Personal history of other infectious and parasitic diseases: Secondary | ICD-10-CM | POA: Diagnosis not present

## 2022-08-24 DIAGNOSIS — D649 Anemia, unspecified: Secondary | ICD-10-CM | POA: Diagnosis present

## 2022-08-24 DIAGNOSIS — I129 Hypertensive chronic kidney disease with stage 1 through stage 4 chronic kidney disease, or unspecified chronic kidney disease: Secondary | ICD-10-CM | POA: Diagnosis not present

## 2022-08-24 DIAGNOSIS — A4151 Sepsis due to Escherichia coli [E. coli]: Principal | ICD-10-CM | POA: Diagnosis present

## 2022-08-24 DIAGNOSIS — K8010 Calculus of gallbladder with chronic cholecystitis without obstruction: Secondary | ICD-10-CM | POA: Diagnosis not present

## 2022-08-24 DIAGNOSIS — Z9049 Acquired absence of other specified parts of digestive tract: Secondary | ICD-10-CM | POA: Diagnosis not present

## 2022-08-24 DIAGNOSIS — E1122 Type 2 diabetes mellitus with diabetic chronic kidney disease: Secondary | ICD-10-CM | POA: Diagnosis not present

## 2022-08-24 DIAGNOSIS — Z85810 Personal history of malignant neoplasm of tongue: Secondary | ICD-10-CM | POA: Diagnosis not present

## 2022-08-24 DIAGNOSIS — R1013 Epigastric pain: Secondary | ICD-10-CM | POA: Diagnosis not present

## 2022-08-24 DIAGNOSIS — I10 Essential (primary) hypertension: Secondary | ICD-10-CM | POA: Diagnosis not present

## 2022-08-24 DIAGNOSIS — Z8546 Personal history of malignant neoplasm of prostate: Secondary | ICD-10-CM

## 2022-08-24 DIAGNOSIS — R1084 Generalized abdominal pain: Secondary | ICD-10-CM | POA: Diagnosis not present

## 2022-08-24 DIAGNOSIS — K219 Gastro-esophageal reflux disease without esophagitis: Secondary | ICD-10-CM | POA: Diagnosis not present

## 2022-08-24 DIAGNOSIS — R7881 Bacteremia: Secondary | ICD-10-CM | POA: Diagnosis not present

## 2022-08-24 DIAGNOSIS — E782 Mixed hyperlipidemia: Secondary | ICD-10-CM | POA: Insufficient documentation

## 2022-08-24 DIAGNOSIS — E119 Type 2 diabetes mellitus without complications: Secondary | ICD-10-CM | POA: Diagnosis not present

## 2022-08-24 DIAGNOSIS — E1165 Type 2 diabetes mellitus with hyperglycemia: Secondary | ICD-10-CM | POA: Insufficient documentation

## 2022-08-24 DIAGNOSIS — Z1152 Encounter for screening for COVID-19: Secondary | ICD-10-CM | POA: Diagnosis not present

## 2022-08-24 DIAGNOSIS — Z7984 Long term (current) use of oral hypoglycemic drugs: Secondary | ICD-10-CM

## 2022-08-24 DIAGNOSIS — K8050 Calculus of bile duct without cholangitis or cholecystitis without obstruction: Secondary | ICD-10-CM | POA: Diagnosis not present

## 2022-08-24 DIAGNOSIS — K8064 Calculus of gallbladder and bile duct with chronic cholecystitis without obstruction: Secondary | ICD-10-CM | POA: Diagnosis not present

## 2022-08-24 DIAGNOSIS — K8030 Calculus of bile duct with cholangitis, unspecified, without obstruction: Secondary | ICD-10-CM | POA: Diagnosis not present

## 2022-08-24 DIAGNOSIS — R748 Abnormal levels of other serum enzymes: Secondary | ICD-10-CM | POA: Diagnosis not present

## 2022-08-24 DIAGNOSIS — N189 Chronic kidney disease, unspecified: Secondary | ICD-10-CM | POA: Diagnosis not present

## 2022-08-24 DIAGNOSIS — K810 Acute cholecystitis: Secondary | ICD-10-CM | POA: Diagnosis not present

## 2022-08-24 LAB — CBC WITH DIFFERENTIAL/PLATELET
Abs Immature Granulocytes: 0.23 10*3/uL — ABNORMAL HIGH (ref 0.00–0.07)
Basophils Absolute: 0 10*3/uL (ref 0.0–0.1)
Basophils Relative: 0 %
Eosinophils Absolute: 0 10*3/uL (ref 0.0–0.5)
Eosinophils Relative: 0 %
HCT: 24.3 % — ABNORMAL LOW (ref 39.0–52.0)
Hemoglobin: 8.3 g/dL — ABNORMAL LOW (ref 13.0–17.0)
Immature Granulocytes: 1 %
Lymphocytes Relative: 1 %
Lymphs Abs: 0.1 10*3/uL — ABNORMAL LOW (ref 0.7–4.0)
MCH: 33.6 pg (ref 26.0–34.0)
MCHC: 34.2 g/dL (ref 30.0–36.0)
MCV: 98.4 fL (ref 80.0–100.0)
Monocytes Absolute: 0.2 10*3/uL (ref 0.1–1.0)
Monocytes Relative: 1 %
Neutro Abs: 16.5 10*3/uL — ABNORMAL HIGH (ref 1.7–7.7)
Neutrophils Relative %: 97 %
Platelets: 159 10*3/uL (ref 150–400)
RBC: 2.47 MIL/uL — ABNORMAL LOW (ref 4.22–5.81)
RDW: 14.6 % (ref 11.5–15.5)
WBC: 17.1 10*3/uL — ABNORMAL HIGH (ref 4.0–10.5)
nRBC: 0 % (ref 0.0–0.2)

## 2022-08-24 LAB — LIPASE, BLOOD: Lipase: 32 U/L (ref 11–51)

## 2022-08-24 LAB — COMPREHENSIVE METABOLIC PANEL
ALT: 269 U/L — ABNORMAL HIGH (ref 0–44)
AST: 512 U/L — ABNORMAL HIGH (ref 15–41)
Albumin: 3.3 g/dL — ABNORMAL LOW (ref 3.5–5.0)
Alkaline Phosphatase: 259 U/L — ABNORMAL HIGH (ref 38–126)
Anion gap: 16 — ABNORMAL HIGH (ref 5–15)
BUN: 45 mg/dL — ABNORMAL HIGH (ref 8–23)
CO2: 19 mmol/L — ABNORMAL LOW (ref 22–32)
Calcium: 8 mg/dL — ABNORMAL LOW (ref 8.9–10.3)
Chloride: 101 mmol/L (ref 98–111)
Creatinine, Ser: 3.18 mg/dL — ABNORMAL HIGH (ref 0.61–1.24)
GFR, Estimated: 20 mL/min — ABNORMAL LOW (ref >60)
Glucose, Bld: 211 mg/dL — ABNORMAL HIGH (ref 70–99)
Potassium: 3.7 mmol/L (ref 3.5–5.1)
Sodium: 136 mmol/L (ref 135–145)
Total Bilirubin: 5.6 mg/dL — ABNORMAL HIGH (ref 0.3–1.2)
Total Protein: 7 g/dL (ref 6.5–8.1)

## 2022-08-24 LAB — URINALYSIS, ROUTINE W REFLEX MICROSCOPIC
Bacteria, UA: NONE SEEN
Bilirubin Urine: NEGATIVE
Glucose, UA: >=500 mg/dL — AB
Ketones, ur: 5 mg/dL — AB
Leukocytes,Ua: NEGATIVE
Nitrite: NEGATIVE
Protein, ur: >=300 mg/dL — AB
Specific Gravity, Urine: 1.022 (ref 1.005–1.030)
pH: 5 (ref 5.0–8.0)

## 2022-08-24 LAB — RESP PANEL BY RT-PCR (FLU A&B, COVID) ARPGX2
Influenza A by PCR: NEGATIVE
Influenza B by PCR: NEGATIVE
SARS Coronavirus 2 by RT PCR: NEGATIVE

## 2022-08-24 LAB — LACTIC ACID, PLASMA
Lactic Acid, Venous: 3.8 mmol/L (ref 0.5–1.9)
Lactic Acid, Venous: 3 mmol/L (ref 0.5–1.9)

## 2022-08-24 LAB — APTT: aPTT: 42 seconds — ABNORMAL HIGH (ref 24–36)

## 2022-08-24 LAB — PROTIME-INR
INR: 1.5 — ABNORMAL HIGH (ref 0.8–1.2)
Prothrombin Time: 18.2 seconds — ABNORMAL HIGH (ref 11.4–15.2)

## 2022-08-24 MED ORDER — METRONIDAZOLE 500 MG/100ML IV SOLN
500.0000 mg | Freq: Once | INTRAVENOUS | Status: AC
  Administered 2022-08-24: 500 mg via INTRAVENOUS
  Filled 2022-08-24: qty 100

## 2022-08-24 MED ORDER — SODIUM CHLORIDE 0.9 % IV SOLN: INTRAVENOUS | Status: DC

## 2022-08-24 MED ORDER — FENTANYL CITRATE PF 50 MCG/ML IJ SOSY
12.5000 ug | PREFILLED_SYRINGE | INTRAMUSCULAR | Status: DC | PRN
  Administered 2022-08-24 – 2022-08-26 (×4): 12.5 ug via INTRAVENOUS
  Filled 2022-08-24 (×4): qty 1

## 2022-08-24 MED ORDER — SODIUM CHLORIDE 0.9 % IV SOLN
2.0000 g | Freq: Once | INTRAVENOUS | Status: AC
  Administered 2022-08-24: 2 g via INTRAVENOUS
  Filled 2022-08-24: qty 20

## 2022-08-24 MED ORDER — LACTATED RINGERS IV SOLN: INTRAVENOUS | Status: AC

## 2022-08-24 NOTE — H&P (Signed)
History and Physical    Patient: Kyle Rogers [id number] DOB: 08-26-1951 DOA: 08/24/2022 DOS: the patient was seen and examined on 08/25/2022 PCP: Wenda Low, MD  Patient coming from: Home  Chief Complaint:  Chief Complaint  Patient presents with   Abnormal Lab   HPI: Kyle Rogers is a 71 y.o. male with medical history significant of type 2 diabetes mellitus, hypertension, hyperlipidemia who presents to the emergency department due to 4 to 5-day onset of abdominal pain which worsens after eating.  He followed up on work done down at his PCPs office this evening and was told that he should go to the emergency department due to elevated liver enzymes.  Patient endorsed intermittent subjective fevers, but denies vomiting, diarrhea, headache.   ED Course:  In the emergency department, he was febrile with a temperature of 100.6, respiratory rate 17/min, pulse 84 bpm, BP 113/57, O2 sat 99% on room air.  Workup in the ED showed normocytic anemia and leukocytosis.   BMP-sodium 136, potassium 3.0, chloride 104, bicarb 19, glucose 211, BUN/creatinine 45/3.18. Albumin 3.3, AST 512, ALT 269, ALP 259.  Total bilirubin 5.6, lipase 32, lactic acid 3.8 > 3.0 Influenza A, B, SARS coronavirus 2 was negative.  Urinalysis was positive for glycosuria. CT abdomen and pelvis without contrast showed gallbladder wall appears thickened and indistinct. Suspect gallstones within the gallbladder. Findings are concerning for acute cholecystitis. This could be further evaluated with right upper quadrant ultrasound if clinically indicated. Chest x-ray showed no acute intrathoracic process Patient was treated with IV ceftriaxone and metronidazole.  IV hydration was provided. Gastroenterology (Dr. Abbey Chatters) was consulted and recommended admitting patient in transfer to [facility] for ERCP. [name] was consulted and will follow-up on patient on consult in the morning per EDP.  Hospitalist was asked to admit  patient for further evaluation and management.  Review of Systems: Review of systems as noted in the HPI. All other systems reviewed and are negative.   Past Medical History:  Diagnosis Date   Anemia    CKD (chronic kidney disease), stage III ([place])    Diabetes mellitus without complication ([place])    History of hepatitis C 03/19/2022   History of tongue cancer 03/19/2022   Hyperlipidemia    Hypertension    Malignant neoplasm of prostate ([place]) 04/25/2021   Thrombocytopenia ([place]) 03/19/2022   Past Surgical History:  Procedure Laterality Date   PROSTATE BIOPSY     tongue surgery      Social History:  reports that he has never smoked. He has never been exposed to tobacco smoke. He has never used smokeless tobacco. He reports that he does not currently use alcohol. He reports that he does not currently use drugs.   No Known Allergies  History reviewed. No pertinent family history.    Prior to Admission medications   Medication Sig Start Date End Date Taking? Authorizing Provider  allopurinol (ZYLOPRIM) 100 MG tablet Take 100 mg by mouth daily. 02/17/21   [provider]  amLODipine (NORVASC) 5 MG tablet Take 5 mg by mouth daily. 07/17/22   [provider]  glimepiride (AMARYL) 1 MG tablet Take 1 mg by mouth every morning. 07/19/22   [provider]  JARDIANCE 10 MG TABS tablet Take 10 mg by mouth daily. 04/18/21   [provider]  JARDIANCE 25 MG TABS tablet Take 25 mg by mouth daily. 08/06/22   [provider]  lisinopril (ZESTRIL) 40 MG tablet Take 40 mg by mouth  daily. 04/06/21   [provider]  metFORMIN (GLUCOPHAGE) 1000 MG tablet Take 1,000 mg by mouth daily. 02/17/21   [provider]  metFORMIN (GLUCOPHAGE) 500 MG tablet Take 500 mg by mouth daily. 07/17/22   [provider]  metoprolol succinate (TOPROL-XL) 25 MG 24 hr tablet Take 25 mg by mouth daily. 08/07/22   [provider]  simvastatin  (ZOCOR) 40 MG tablet Take 40 mg by mouth at bedtime. 02/17/21   [provider]  sodium bicarbonate 650 MG tablet Take 650 mg by mouth 3 (three) times daily. 04/05/21   [provider]    Physical Exam: BP (!) 104/54 (BP Location: Right Arm)   Pulse 81   Temp 97.8 F (36.6 C) (Oral)   Resp 18   Ht _0  (1.676 m)   Wt 67.6 kg   SpO2 99%   BMI 24.05 kg/m   General: 71 y.o. year-old male ill appearing, but in no acute distress.  Alert and oriented x3. HEENT: NCAT, EOMI Neck: Supple, trachea medial Cardiovascular: Regular rate and rhythm with no rubs or gallops.  No thyromegaly or JVD noted.  No lower extremity edema. 2/4 pulses in all 4 extremities. Respiratory: Clear to auscultation with no wheezes or rales. Good inspiratory effort. Abdomen: Soft, nontender nondistended with normal bowel sounds x4 quadrants. Muskuloskeletal: No cyanosis, clubbing or edema noted bilaterally Neuro: CN II-XII intact, strength 5/5 x 4, sensation, reflexes intact Skin: No ulcerative lesions noted or rashes Psychiatry: Judgement and insight appear normal. Mood is appropriate for condition and setting          Labs on Admission:  Basic Metabolic Panel: Recent Labs  Lab 08/24/22 1812  NA 136  K 3.7  CL 101  CO2 19*  GLUCOSE 211*  BUN 45*  CREATININE 3.18*  CALCIUM 8.0*   Liver Function Tests: Recent Labs  Lab 08/24/22 1812  AST 512*  ALT 269*  ALKPHOS 259*  BILITOT 5.6*  PROT 7.0  ALBUMIN 3.3*   Recent Labs  Lab 08/24/22 1812  LIPASE 32   No results for input(s): "AMMONIA" in the last 168 hours. CBC: Recent Labs  Lab 08/24/22 1812  WBC 17.1*  NEUTROABS 16.5*  HGB 8.3*  HCT 24.3*  MCV 98.4  PLT 159   Cardiac Enzymes: No results for input(s): "CKTOTAL", "CKMB", "CKMBINDEX", "TROPONINI" in the last 168 hours.  BNP (last 3 results) No results for input(s): "BNP" in the last 8760 hours.  ProBNP (last 3 results) No results for input(s): "PROBNP" in the  last 8760 hours.  CBG: No results for input(s): "GLUCAP" in the last 168 hours.  Radiological Exams on Admission: No results found.  EKG: I independently viewed the EKG done and my findings are as followed: EKG was not done in the ED  Assessment/Plan Present on Admission:  Acute cholangitis  Principal Problem:   Acute cholangitis Active Problems:   Severe sepsis ([place])   Uncontrolled type 2 diabetes mellitus with hyperglycemia, without long-term current use of insulin (HCC)   Essential hypertension   Mixed hyperlipidemia   CKD (chronic kidney disease), stage IV (HCC)  Severe sepsis secondary to acute cholangitis Lactic acidosis Patient met sepsis criteria due to being febrile and having leukocytosis with a left shift with suspicion for acute cholecystitis Patient was treated with IV ceftriaxone and metronidazole, he will continue same at this time Lactic acid 3.8 > 3.0, continue to trend lactic acid Continue IV hydration Continue IV fentanyl as needed Blood culture and  hepatitis panel pending RUQ ultrasound will be done in the morning Gastroenterologist (Dr. Abbey Chatters) was consulted and recommended admitting patient to [facility] for ERCP  Transaminitis AST 512, ALT 269, ALP 259 This is possibly secondary to above Continue treatment as described above  Type 2 diabetes mellitus with uncontrolled hyperglycemia Continue Semglee 18 units nightly and adjust dose accordingly Continue ISS and hypoglycemia protocol  Essential hypertension BP meds will be held at this time due to soft blood pressure  Mixed hyperlipidemia Statin will be held due to transaminitis  CKD stage 4 BUN/creatinine 45/3.18. Renally adjust medications, avoid nephrotoxic agents/dehydration/hypotension   DVT prophylaxis: SCDs  Code Status: Full code  Family Communication: None at bedside  Consults: Gastroenterology (by EDP)  Severity of Illness: The appropriate patient status for this patient is  INPATIENT. Inpatient status is judged to be reasonable and necessary in order to provide the required intensity of service to ensure the patient's safety. The patient's presenting symptoms, physical exam findings, and initial radiographic and laboratory data in the context of their chronic comorbidities is felt to place them at high risk for further clinical deterioration. Furthermore, it is not anticipated that the patient will be medically stable for discharge from the hospital within 2 midnights of admission.   * I certify that at the point of admission it is my clinical judgment that the patient will require inpatient hospital care spanning beyond 2 midnights from the point of admission due to high intensity of service, high risk for further deterioration and high frequency of surveillance required.*  Author: Bernadette Hoit, DO 08/25/2022 12:47 AM  For on call review [email or web address].

## 2022-08-24 NOTE — Sepsis Progress Note (Signed)
Following for sepsis monitoring ?

## 2022-08-24 NOTE — ED Notes (Signed)
Pt unable to give urine at this time. Gave a urinal

## 2022-08-24 NOTE — ED Notes (Signed)
Paged [facility] to Dr Sabra Heck.

## 2022-08-24 NOTE — H&P (Incomplete)
History and Physical    Patient: Kyle Rogers [id number] DOB: 04-May-1951 DOA: 08/24/2022 DOS: the patient was seen and examined on 08/24/2022 PCP: Wenda Low, MD  Patient coming from: Home  Chief Complaint:  Chief Complaint  Patient presents with  . Abnormal Lab   HPI: Kyle Rogers is a 71 y.o. male with medical history significant of type 2 diabetes mellitus, hypertension, hyperlipidemia who presents to the emergency department due to 4 to 5-day onset of abdominal pain which worsens after eating.  He followed up on work done down at his PCPs office this evening and was told that he should go to the emergency department due to elevated liver enzymes.  Patient endorsed intermittent subjective fevers, but denies vomiting, diarrhea, headache.   ED Course:  In the emergency department, he was febrile with a temperature of 100.6, respiratory rate 17/min, pulse 84 bpm, BP 113/57, O2 sat 99% on room air.  Workup in the ED showed normocytic anemia and leukocytosis.   BMP-sodium 136, potassium 3.0, chloride 104, bicarb 19, glucose 211, BUN/creatinine 45/3.18. Albumin 3.3, AST 512, ALT 269, ALP 259.  Total bilirubin 5.6, lipase 32, lactic acid 3.8 > 3.0 Influenza A, B, SARS coronavirus 2 was negative.  Urinalysis was positive for glycosuria. CT abdomen and pelvis without contrast showed gallbladder wall appears thickened and indistinct. Suspect gallstones within the gallbladder. Findings are concerning for acute cholecystitis. This could be further evaluated with right upper quadrant ultrasound if clinically indicated. Chest x-ray showed no acute intrathoracic process Patient was treated with IV ceftriaxone and metronidazole.  IV hydration was provided. Gastroenterology (Dr. Abbey Chatters) was consulted and recommended admitting patient in transfer to [facility] for ERCP. [name] was consulted and will follow-up on patient on consult in the morning per EDP.  Hospitalist was asked to admit  patient for further evaluation and management.  Review of Systems: Review of systems as noted in the HPI. All other systems reviewed and are negative.   Past Medical History:  Diagnosis Date  . Anemia   . CKD (chronic kidney disease), stage III ([place])   . Diabetes mellitus without complication ([place])   . History of hepatitis C 03/19/2022  . History of tongue cancer 03/19/2022  . Hyperlipidemia   . Hypertension   . Malignant neoplasm of prostate ([place]) 04/25/2021  . Thrombocytopenia ([place]) 03/19/2022   Past Surgical History:  Procedure Laterality Date  . PROSTATE BIOPSY    . tongue surgery      Social History:  reports that he has never smoked. He has never been exposed to tobacco smoke. He has never used smokeless tobacco. He reports that he does not currently use alcohol. He reports that he does not currently use drugs.   No Known Allergies  History reviewed. No pertinent family history.  ***  Prior to Admission medications   Medication Sig Start Date End Date Taking? Authorizing Provider  allopurinol (ZYLOPRIM) 100 MG tablet Take 100 mg by mouth daily. 02/17/21   [provider]  amLODipine (NORVASC) 5 MG tablet Take 5 mg by mouth daily. 07/17/22   [provider]  glimepiride (AMARYL) 1 MG tablet Take 1 mg by mouth every morning. 07/19/22   [provider]  JARDIANCE 10 MG TABS tablet Take 10 mg by mouth daily. 04/18/21   [provider]  JARDIANCE 25 MG TABS tablet Take 25 mg by mouth daily. 08/06/22   [provider]  lisinopril (ZESTRIL) 40 MG tablet Take 40 mg by mouth  daily. 04/06/21   [provider]  metFORMIN (GLUCOPHAGE) 1000 MG tablet Take 1,000 mg by mouth daily. 02/17/21   [provider]  metFORMIN (GLUCOPHAGE) 500 MG tablet Take 500 mg by mouth daily. 07/17/22   [provider]  metoprolol succinate (TOPROL-XL) 25 MG 24 hr tablet Take 25 mg by mouth daily. 08/07/22   [provider]   simvastatin (ZOCOR) 40 MG tablet Take 40 mg by mouth at bedtime. 02/17/21   [provider]  sodium bicarbonate 650 MG tablet Take 650 mg by mouth 3 (three) times daily. 04/05/21   [provider]    Physical Exam: BP 106/66   Pulse 85   Temp 99.2 F (37.3 C)   Resp 20   Ht _0  (1.676 m)   Wt 67.6 kg   SpO2 95%   BMI 24.05 kg/m   General: 71 y.o. year-old male ill appearing, but in no acute distress.  Alert and oriented x3. HEENT: NCAT, EOMI Neck: Supple, trachea medial Cardiovascular: Regular rate and rhythm with no rubs or gallops.  No thyromegaly or JVD noted.  No lower extremity edema. 2/4 pulses in all 4 extremities. Respiratory: Clear to auscultation with no wheezes or rales. Good inspiratory effort. Abdomen: Soft, nontender nondistended with normal bowel sounds x4 quadrants. Muskuloskeletal: No cyanosis, clubbing or edema noted bilaterally Neuro: CN II-XII intact, strength 5/5 x 4, sensation, reflexes intact Skin: No ulcerative lesions noted or rashes Psychiatry: Judgement and insight appear normal. Mood is appropriate for condition and setting          Labs on Admission:  Basic Metabolic Panel: Recent Labs  Lab 08/24/22 1812  NA 136  K 3.7  CL 101  CO2 19*  GLUCOSE 211*  BUN 45*  CREATININE 3.18*  CALCIUM 8.0*   Liver Function Tests: Recent Labs  Lab 08/24/22 1812  AST 512*  ALT 269*  ALKPHOS 259*  BILITOT 5.6*  PROT 7.0  ALBUMIN 3.3*   Recent Labs  Lab 08/24/22 1812  LIPASE 32   No results for input(s): "AMMONIA" in the last 168 hours. CBC: Recent Labs  Lab 08/24/22 1812  WBC 17.1*  NEUTROABS 16.5*  HGB 8.3*  HCT 24.3*  MCV 98.4  PLT 159   Cardiac Enzymes: No results for input(s): "CKTOTAL", "CKMB", "CKMBINDEX", "TROPONINI" in the last 168 hours.  BNP (last 3 results) No results for input(s): "BNP" in the last 8760 hours.  ProBNP (last 3 results) No results for input(s): "PROBNP" in the last 8760  hours.  CBG: No results for input(s): "GLUCAP" in the last 168 hours.  Radiological Exams on Admission: No results found.  EKG: I independently viewed the EKG done and my findings are as followed: EKG was not done in the ED  Assessment/Plan Present on Admission: . Acute cholangitis  Principal Problem:   Acute cholangitis  Severe sepsis secondary to acute cholecystitis, rule out acute cholangitis Lactic acidosis Patient met sepsis criteria due to being febrile and having leukocytosis with a left shift with suspicion for acute cholecystitis Patient was treated with IV ceftriaxone and metronidazole, she will continue same at this time Continue IV hydration RUQ ultrasound will be done in the morning Gastroenterologist (Dr. Abbey Chatters) was consulted and recommended admitting patient to [facility] for ERCP   Type 2 diabetes mellitus with uncontrolled hyperglycemia Essential hypertension Mixed hyperlipidemia  DVT prophylaxis: ***   Code Status: ***   Family Communication: ***   Disposition Plan: ***   Consults called: ***  Admission status: ***     Bernadette Hoit MD [facility] Pager [phone]  If 7PM-7AM, please contact night-coverage [email or web address] Password [email or web address]  08/24/2022, 11:24 PM      Review of Systems: {ROS_Text:26778} Past Medical History:  Diagnosis Date  . Anemia   . CKD (chronic kidney disease), stage III ([place])   . Diabetes mellitus without complication ([place])   . History of hepatitis C 03/19/2022  . History of tongue cancer 03/19/2022  . Hyperlipidemia   . Hypertension   . Malignant neoplasm of prostate ([place]) 04/25/2021  . Thrombocytopenia ([place]) 03/19/2022   Past Surgical History:  Procedure Laterality Date  . PROSTATE BIOPSY    . tongue surgery     Social History:  reports that he has never smoked. He has never been exposed to tobacco smoke. He has never used smokeless tobacco. He reports that he does not currently use alcohol. He  reports that he does not currently use drugs.  No Known Allergies  History reviewed. No pertinent family history.  Prior to Admission medications   Medication Sig Start Date End Date Taking? Authorizing Provider  allopurinol (ZYLOPRIM) 100 MG tablet Take 100 mg by mouth daily. 02/17/21   [provider]  amLODipine (NORVASC) 5 MG tablet Take 5 mg by mouth daily. 07/17/22   [provider]  glimepiride (AMARYL) 1 MG tablet Take 1 mg by mouth every morning. 07/19/22   [provider]  JARDIANCE 10 MG TABS tablet Take 10 mg by mouth daily. 04/18/21   [provider]  JARDIANCE 25 MG TABS tablet Take 25 mg by mouth daily. 08/06/22   [provider]  lisinopril (ZESTRIL) 40 MG tablet Take 40 mg by mouth daily. 04/06/21   [provider]  metFORMIN (GLUCOPHAGE) 1000 MG tablet Take 1,000 mg by mouth daily. 02/17/21   [provider]  metFORMIN (GLUCOPHAGE) 500 MG tablet Take 500 mg by mouth daily. 07/17/22   [provider]  metoprolol succinate (TOPROL-XL) 25 MG 24 hr tablet Take 25 mg by mouth daily. 08/07/22   [provider]  simvastatin (ZOCOR) 40 MG tablet Take 40 mg by mouth at bedtime. 02/17/21   [provider]  sodium bicarbonate 650 MG tablet Take 650 mg by mouth 3 (three) times daily. 04/05/21   [provider]    Physical Exam: Vitals:   08/24/22 1748 08/24/22 1749 08/24/22 1752  BP:  (!) 113/57   Pulse:  84   Resp:  17   Temp:   (!) 100.6 F (38.1 C)  TempSrc:   Temporal  SpO2:  99%   Weight: 67.6 kg    Height: _0  (1.676 m)     *** Data Reviewed: {Tip this will not be part of the note when signed- Document your independent interpretation of telemetry tracing, EKG, lab, Radiology test or any other diagnostic tests. Add any new diagnostic test ordered today. (Optional):26781} {Results:26384}  Assessment and Plan: No notes have been filed under this hospital service. Service:  Hospitalist     Advance Care Planning:   Code Status: Not on file ***  Consults: ***  Family Communication: ***  Severity of Illness: {Observation/Inpatient:21159}  Author: Bernadette Hoit, DO 08/24/2022 7:59 PM  For on call review [email or web address].

## 2022-08-24 NOTE — Progress Notes (Signed)
Date and time results received: 08/24/22 1850 (use smartphrase ".now" to insert current time)  Test: Lactic acid Critical Value: 3.8  Name of Provider Notified: PA Ovid Curd  Orders Received? Or Actions Taken?: Orders Received - See Orders for details

## 2022-08-24 NOTE — ED Triage Notes (Signed)
Pt was told liver levels were abnormal.  Denies pain at present. Abd pain at night.  + diarrhea. Wife states she has noticed some confusion from pt.

## 2022-08-24 NOTE — ED Provider Notes (Signed)
[facility] EMERGENCY DEPARTMENT Provider Note   CSN: [id number] Arrival date & time: 08/24/22  1728     History  Chief Complaint  Patient presents with   Abnormal Lab    Kyle Rogers is a 71 y.o. male.   Abnormal Lab    Patient is a 71 year old male, history of diabetes on Jardiance and metformin, high cholesterol on Zocor and hypertension on lisinopril.  He does take allopurinol.  He presents to the hospital today with a complaint of intermittent abdominal discomfort which seems to get worse after eating.  It is primarily located in the lower abdomen when it does occur.  He had gone to his doctor's office this evening where he had lab work done and was told that his liver and gallbladder test were high and then needed to come to the hospital for evaluation.  The patient does report that he has intermittent fevers with this and feels like he has a fever at this time.  No vomiting, occasional diarrhea, no rashes no swelling no headache.  He has never had any abdominal surgery.  Home Medications Prior to Admission medications   Medication Sig Start Date End Date Taking? Authorizing Provider  allopurinol (ZYLOPRIM) 100 MG tablet Take 100 mg by mouth daily. 02/17/21   [provider]  JARDIANCE 10 MG TABS tablet Take 10 mg by mouth daily. 04/18/21   [provider]  lisinopril (ZESTRIL) 40 MG tablet Take 40 mg by mouth daily. 04/06/21   [provider]  metFORMIN (GLUCOPHAGE) 1000 MG tablet Take 1,000 mg by mouth daily. 02/17/21   [provider]  simvastatin (ZOCOR) 40 MG tablet Take 40 mg by mouth at bedtime. 02/17/21   [provider]  sodium bicarbonate 650 MG tablet Take 650 mg by mouth 3 (three) times daily. 04/05/21   [provider]      Allergies    Patient has no known allergies.    Review of Systems   Review of Systems  All other systems reviewed and are negative.   Physical Exam Updated Vital Signs BP (!) 113/57  (BP Location: Right Arm)   Pulse 84   Temp (!) 100.6 F (38.1 C) (Temporal)   Resp 17   Ht 1.676 m ('5\' 6"'$ )   Wt 67.6 kg   SpO2 99%   BMI 24.05 kg/m  Physical Exam Vitals and nursing note reviewed.  Constitutional:      General: He is not in acute distress.    Appearance: He is well-developed.  HENT:     Head: Normocephalic and atraumatic.     Mouth/Throat:     Pharynx: No oropharyngeal exudate.  Eyes:     General:        Right eye: No discharge.        Left eye: No discharge.     Pupils: Pupils are equal, round, and reactive to light.     Comments: Possibly mild jaundice  Neck:     Thyroid: No thyromegaly.     Vascular: No JVD.  Cardiovascular:     Rate and Rhythm: Normal rate and regular rhythm.     Heart sounds: Normal heart sounds. No murmur heard.    No friction rub. No gallop.  Pulmonary:     Effort: Pulmonary effort is normal. No respiratory distress.     Breath sounds: Normal breath sounds. No wheezing or rales.  Abdominal:     General: Bowel sounds are normal. There is no distension.  Palpations: Abdomen is soft. There is no mass.     Tenderness: There is no abdominal tenderness.     Comments: Absolutely no abdominal tenderness or masses  Musculoskeletal:        General: No tenderness. Normal range of motion.     Cervical back: Normal range of motion and neck supple.     Right lower leg: No edema.     Left lower leg: No edema.  Lymphadenopathy:     Cervical: No cervical adenopathy.  Skin:    General: Skin is warm and dry.     Findings: No erythema or rash.  Neurological:     Mental Status: He is alert.     Coordination: Coordination normal.  Psychiatric:        Behavior: Behavior normal.     ED Results / Procedures / Treatments   Labs (all labs ordered are listed, but only abnormal results are displayed) Labs Reviewed  COMPREHENSIVE METABOLIC PANEL - Abnormal; Notable for the following components:      Result Value   CO2 19 (*)    Glucose,  Bld 211 (*)    BUN 45 (*)    Creatinine, Ser 3.18 (*)    Calcium 8.0 (*)    Albumin 3.3 (*)    AST 512 (*)    ALT 269 (*)    Alkaline Phosphatase 259 (*)    Total Bilirubin 5.6 (*)    GFR, Estimated 20 (*)    Anion gap 16 (*)    All other components within normal limits  CBC WITH DIFFERENTIAL/PLATELET - Abnormal; Notable for the following components:   WBC 17.1 (*)    RBC 2.47 (*)    Hemoglobin 8.3 (*)    HCT 24.3 (*)    Neutro Abs 16.5 (*)    Lymphs Abs 0.1 (*)    Abs Immature Granulocytes 0.23 (*)    All other components within normal limits  LACTIC ACID, PLASMA - Abnormal; Notable for the following components:   Lactic Acid, Venous 3.8 (*)    All other components within normal limits  RESP PANEL BY RT-PCR (FLU A&B, COVID) ARPGX2  CULTURE, BLOOD (ROUTINE X 2)  CULTURE, BLOOD (ROUTINE X 2)  URINE CULTURE  LIPASE, BLOOD  HEPATITIS PANEL, ACUTE  URINALYSIS, ROUTINE W REFLEX MICROSCOPIC  LACTIC ACID, PLASMA  PROTIME-INR  APTT    EKG None  Radiology No results found.  Procedures .Critical Care  Performed by: Noemi Chapel, MD Authorized by: Noemi Chapel, MD   Critical care provider statement:    Critical care time (minutes):  30   Critical care time was exclusive of:  Separately billable procedures and treating other patients and teaching time   Critical care was necessary to treat or prevent imminent or life-threatening deterioration of the following conditions:  Sepsis   Critical care was time spent personally by me on the following activities:  Development of treatment plan with patient or surrogate, discussions with consultants, evaluation of patient's response to treatment, examination of patient, ordering and review of laboratory studies, ordering and review of radiographic studies, ordering and performing treatments and interventions, pulse oximetry, re-evaluation of patient's condition and review of old charts   I assumed direction of critical care for this  patient from another provider in my specialty: no     Care discussed with: admitting provider   Comments:           Medications Ordered in ED Medications  0.9 %  sodium chloride infusion (  has no administration in time range)  lactated ringers infusion (has no administration in time range)  cefTRIAXone (ROCEPHIN) 2 g in sodium chloride 0.9 % 100 mL IVPB (has no administration in time range)  metroNIDAZOLE (FLAGYL) IVPB 500 mg (has no administration in time range)    ED Course/ Medical Decision Making/ A&P                           Medical Decision Making Amount and/or Complexity of Data Reviewed Labs: ordered. Radiology: ordered. ECG/medicine tests: ordered.  Risk Prescription drug management. Decision regarding hospitalization.   This patient presents to the ED for concern of abnormal liver function test.  This patient does not drink alcohol and he does not have any history of gallbladder disease.  He does not take Tylenol.  The differential diagnosis includes cholecystitis, pancreatitis, gallstone disease, choledocholithiasis, cholangitis, primary tumor, hepatitis    Additional history obtained:  Additional history obtained from electronic medical record External records from outside source obtained and reviewed including laboratory work-up performed earlier today is not available, hepatitis C antibody performed in June of this year was nonreactive, HIV was nonreactive   Lab Tests:  I Ordered, and personally interpreted labs.  The pertinent results include: EC showing a leukocytosis, creatinine is at 3, baseline is 2 from 1 year ago, bilirubin is 5.6, AST of 512 ALT of 269 and alkaline phosphatase of 259.  He has an anion gap of 16, he is anemic at 8.3.  Compared to prior labs these are all abnormal.  Lipase was normal, lactic acid was 3.8   Imaging Studies ordered:  I ordered imaging studies including CT scan of the abdomen and pelvis I independently visualized and  interpreted imaging which showed what appears to be thickened gallbladder wall with some pericholecystic fluid though the limitations of a noncontrast CT make it difficult to read more into it than that. I agree with the radiologist interpretation   Medicines ordered and prescription drug management:  I ordered medication including antibiotics for cholangitis Reevaluation of the patient after these medicines showed that the patient improved I have reviewed the patients home medicines and have made adjustments as needed   Problem List / ED Course:  The patient is septic with what appears to be acute cholangitis, he has a leukocytosis of fever and a source of infection the intra-abdominal area.  I discussed his care with the consultant Dr. Abbey Chatters who agrees that the patient needs to be admitted and transferred down to [facility] for ERCP, I will discuss with the hospitalist for admission   Social Determinants of Health:  Critically ill with sepsis          Final Clinical Impression(s) / ED Diagnoses Final diagnoses:  Acute cholangitis     Noemi Chapel, MD 08/24/22 1935

## 2022-08-25 ENCOUNTER — Inpatient Hospital Stay (HOSPITAL_COMMUNITY): Payer: Medicare Other

## 2022-08-25 ENCOUNTER — Encounter (HOSPITAL_COMMUNITY): Payer: Self-pay | Admitting: Internal Medicine

## 2022-08-25 ENCOUNTER — Encounter (HOSPITAL_COMMUNITY)
Admission: EM | Disposition: A | Discharge status: Home / Self Care | Payer: Self-pay | Source: Home / Self Care | Attending: Internal Medicine

## 2022-08-25 ENCOUNTER — Inpatient Hospital Stay (HOSPITAL_COMMUNITY): Payer: Medicare Other | Admitting: Anesthesiology

## 2022-08-25 DIAGNOSIS — Z7984 Long term (current) use of oral hypoglycemic drugs: Secondary | ICD-10-CM

## 2022-08-25 DIAGNOSIS — D631 Anemia in chronic kidney disease: Secondary | ICD-10-CM

## 2022-08-25 DIAGNOSIS — D649 Anemia, unspecified: Secondary | ICD-10-CM

## 2022-08-25 DIAGNOSIS — K838 Other specified diseases of biliary tract: Secondary | ICD-10-CM

## 2022-08-25 DIAGNOSIS — E1122 Type 2 diabetes mellitus with diabetic chronic kidney disease: Secondary | ICD-10-CM

## 2022-08-25 DIAGNOSIS — K8309 Other cholangitis: Secondary | ICD-10-CM

## 2022-08-25 DIAGNOSIS — N189 Chronic kidney disease, unspecified: Secondary | ICD-10-CM

## 2022-08-25 DIAGNOSIS — I129 Hypertensive chronic kidney disease with stage 1 through stage 4 chronic kidney disease, or unspecified chronic kidney disease: Secondary | ICD-10-CM

## 2022-08-25 DIAGNOSIS — K810 Acute cholecystitis: Secondary | ICD-10-CM

## 2022-08-25 DIAGNOSIS — E1165 Type 2 diabetes mellitus with hyperglycemia: Secondary | ICD-10-CM | POA: Insufficient documentation

## 2022-08-25 DIAGNOSIS — N184 Chronic kidney disease, stage 4 (severe): Secondary | ICD-10-CM | POA: Insufficient documentation

## 2022-08-25 DIAGNOSIS — I10 Essential (primary) hypertension: Secondary | ICD-10-CM | POA: Insufficient documentation

## 2022-08-25 DIAGNOSIS — A419 Sepsis, unspecified organism: Secondary | ICD-10-CM | POA: Insufficient documentation

## 2022-08-25 DIAGNOSIS — E782 Mixed hyperlipidemia: Secondary | ICD-10-CM | POA: Insufficient documentation

## 2022-08-25 DIAGNOSIS — K8030 Calculus of bile duct with cholangitis, unspecified, without obstruction: Secondary | ICD-10-CM

## 2022-08-25 HISTORY — PX: ERCP: SHX5425

## 2022-08-25 HISTORY — PX: REMOVAL OF STONES: SHX5545

## 2022-08-25 HISTORY — PX: SPHINCTEROTOMY: SHX5544

## 2022-08-25 LAB — CBC
HCT: 19.3 % — ABNORMAL LOW (ref 39.0–52.0)
HCT: 22.6 % — ABNORMAL LOW (ref 39.0–52.0)
Hemoglobin: 7 g/dL — ABNORMAL LOW (ref 13.0–17.0)
Hemoglobin: 7.9 g/dL — ABNORMAL LOW (ref 13.0–17.0)
MCH: 34.7 pg — ABNORMAL HIGH (ref 26.0–34.0)
MCH: 33.2 pg (ref 26.0–34.0)
MCHC: 36.3 g/dL — ABNORMAL HIGH (ref 30.0–36.0)
MCHC: 35 g/dL (ref 30.0–36.0)
MCV: 95.5 fL (ref 80.0–100.0)
MCV: 95 fL (ref 80.0–100.0)
Platelets: 103 10*3/uL — ABNORMAL LOW (ref 150–400)
Platelets: 80 10*3/uL — ABNORMAL LOW (ref 150–400)
RBC: 2.02 MIL/uL — ABNORMAL LOW (ref 4.22–5.81)
RBC: 2.38 MIL/uL — ABNORMAL LOW (ref 4.22–5.81)
RDW: 14.6 % (ref 11.5–15.5)
RDW: 15.4 % (ref 11.5–15.5)
WBC: 11.1 10*3/uL — ABNORMAL HIGH (ref 4.0–10.5)
WBC: 8.5 10*3/uL (ref 4.0–10.5)
nRBC: 0 % (ref 0.0–0.2)
nRBC: 0 % (ref 0.0–0.2)

## 2022-08-25 LAB — BLOOD CULTURE ID PANEL (REFLEXED) - BCID2

## 2022-08-25 LAB — COMPREHENSIVE METABOLIC PANEL
ALT: 232 U/L — ABNORMAL HIGH (ref 0–44)
AST: 527 U/L — ABNORMAL HIGH (ref 15–41)
Albumin: 2.5 g/dL — ABNORMAL LOW (ref 3.5–5.0)
Alkaline Phosphatase: 197 U/L — ABNORMAL HIGH (ref 38–126)
Anion gap: 14 (ref 5–15)
BUN: 52 mg/dL — ABNORMAL HIGH (ref 8–23)
CO2: 21 mmol/L — ABNORMAL LOW (ref 22–32)
Calcium: 7.9 mg/dL — ABNORMAL LOW (ref 8.9–10.3)
Chloride: 104 mmol/L (ref 98–111)
Creatinine, Ser: 3.27 mg/dL — ABNORMAL HIGH (ref 0.61–1.24)
GFR, Estimated: 19 mL/min — ABNORMAL LOW (ref >60)
Glucose, Bld: 190 mg/dL — ABNORMAL HIGH (ref 70–99)
Potassium: 3.9 mmol/L (ref 3.5–5.1)
Sodium: 139 mmol/L (ref 135–145)
Total Bilirubin: 4.4 mg/dL — ABNORMAL HIGH (ref 0.3–1.2)
Total Protein: 5.6 g/dL — ABNORMAL LOW (ref 6.5–8.1)

## 2022-08-25 LAB — GLUCOSE, CAPILLARY
Glucose-Capillary: 136 mg/dL — ABNORMAL HIGH (ref 70–99)
Glucose-Capillary: 163 mg/dL — ABNORMAL HIGH (ref 70–99)
Glucose-Capillary: 151 mg/dL — ABNORMAL HIGH (ref 70–99)
Glucose-Capillary: 129 mg/dL — ABNORMAL HIGH (ref 70–99)

## 2022-08-25 LAB — MAGNESIUM: Magnesium: 1.3 mg/dL — ABNORMAL LOW (ref 1.7–2.4)

## 2022-08-25 LAB — PHOSPHORUS: Phosphorus: 5.7 mg/dL — ABNORMAL HIGH (ref 2.5–4.6)

## 2022-08-25 LAB — HEPATITIS PANEL, ACUTE
HCV Ab: NONREACTIVE
Hep A IgM: NONREACTIVE
Hep B C IgM: NONREACTIVE
Hepatitis B Surface Ag: NONREACTIVE

## 2022-08-25 LAB — PREPARE RBC (CROSSMATCH)

## 2022-08-25 LAB — LACTIC ACID, PLASMA: Lactic Acid, Venous: 1.6 mmol/L (ref 0.5–1.9)

## 2022-08-25 SURGERY — ERCP, WITH INTERVENTION IF INDICATED
Anesthesia: General

## 2022-08-25 MED ORDER — METRONIDAZOLE 500 MG/100ML IV SOLN
500.0000 mg | Freq: Two times a day (BID) | INTRAVENOUS | Status: DC
  Administered 2022-08-25 – 2022-08-26 (×4): 500 mg via INTRAVENOUS
  Filled 2022-08-25 (×4): qty 100

## 2022-08-25 MED ORDER — ONDANSETRON HCL 4 MG/2ML IJ SOLN
INTRAMUSCULAR | Status: DC | PRN
  Administered 2022-08-25: 4 mg via INTRAVENOUS

## 2022-08-25 MED ORDER — PHENYLEPHRINE HCL-NACL 20-0.9 MG/250ML-% IV SOLN
INTRAVENOUS | Status: DC | PRN
  Administered 2022-08-25: 25 ug/min via INTRAVENOUS

## 2022-08-25 MED ORDER — LIDOCAINE 2% (20 MG/ML) 5 ML SYRINGE
INTRAMUSCULAR | Status: DC | PRN
  Administered 2022-08-25: 60 mg via INTRAVENOUS

## 2022-08-25 MED ORDER — SODIUM CHLORIDE 0.9 % IV SOLN: INTRAVENOUS | Status: DC | PRN

## 2022-08-25 MED ORDER — CIPROFLOXACIN IN D5W 400 MG/200ML IV SOLN
INTRAVENOUS | Status: AC
  Filled 2022-08-25: qty 200

## 2022-08-25 MED ORDER — INSULIN ASPART 100 UNIT/ML IJ SOLN
0.0000 [IU] | Freq: Three times a day (TID) | INTRAMUSCULAR | Status: DC
  Administered 2022-08-25: 2 [IU] via SUBCUTANEOUS
  Administered 2022-08-25: 1 [IU] via SUBCUTANEOUS
  Administered 2022-08-27: 5 [IU] via SUBCUTANEOUS

## 2022-08-25 MED ORDER — ROCURONIUM BROMIDE 10 MG/ML (PF) SYRINGE
PREFILLED_SYRINGE | INTRAVENOUS | Status: DC | PRN
  Administered 2022-08-25: 50 mg via INTRAVENOUS

## 2022-08-25 MED ORDER — FENTANYL CITRATE (PF) 100 MCG/2ML IJ SOLN
INTRAMUSCULAR | Status: AC
  Filled 2022-08-25: qty 2

## 2022-08-25 MED ORDER — CIPROFLOXACIN IN D5W 400 MG/200ML IV SOLN
INTRAVENOUS | Status: DC | PRN
  Administered 2022-08-25: 400 mg via INTRAVENOUS

## 2022-08-25 MED ORDER — PROPOFOL 10 MG/ML IV BOLUS
INTRAVENOUS | Status: DC | PRN
  Administered 2022-08-25: 100 mg via INTRAVENOUS

## 2022-08-25 MED ORDER — GLUCAGON HCL RDNA (DIAGNOSTIC) 1 MG IJ SOLR
INTRAMUSCULAR | Status: AC
  Filled 2022-08-25: qty 1

## 2022-08-25 MED ORDER — SODIUM CHLORIDE 0.9 % IV SOLN
2.0000 g | INTRAVENOUS | Status: DC
  Administered 2022-08-25 – 2022-08-27 (×3): 2 g via INTRAVENOUS
  Filled 2022-08-25 (×3): qty 20

## 2022-08-25 MED ORDER — DICLOFENAC SUPPOSITORY 100 MG
RECTAL | Status: DC | PRN
  Administered 2022-08-25: 100 mg via RECTAL

## 2022-08-25 MED ORDER — SODIUM CHLORIDE 0.9 % IV BOLUS
250.0000 mL | Freq: Once | INTRAVENOUS | Status: AC
  Administered 2022-08-25: 250 mL via INTRAVENOUS

## 2022-08-25 MED ORDER — INSULIN ASPART 100 UNIT/ML IJ SOLN
0.0000 [IU] | Freq: Every day | INTRAMUSCULAR | Status: DC
  Administered 2022-08-26: 3 [IU] via SUBCUTANEOUS

## 2022-08-25 MED ORDER — DICLOFENAC SUPPOSITORY 100 MG
RECTAL | Status: AC
  Filled 2022-08-25: qty 1

## 2022-08-25 MED ORDER — PHENYLEPHRINE 80 MCG/ML (10ML) SYRINGE FOR IV PUSH (FOR BLOOD PRESSURE SUPPORT)
PREFILLED_SYRINGE | INTRAVENOUS | Status: DC | PRN
  Administered 2022-08-25: 160 ug via INTRAVENOUS

## 2022-08-25 MED ORDER — INSULIN GLARGINE-YFGN 100 UNIT/ML ~~LOC~~ SOLN
8.0000 [IU] | Freq: Every day | SUBCUTANEOUS | Status: DC
  Administered 2022-08-25 – 2022-08-26 (×3): 8 [IU] via SUBCUTANEOUS
  Filled 2022-08-25 (×4): qty 0.08

## 2022-08-25 MED ORDER — SODIUM CHLORIDE 0.9% IV SOLUTION: Freq: Once | INTRAVENOUS | Status: AC

## 2022-08-25 MED ORDER — FENTANYL CITRATE (PF) 100 MCG/2ML IJ SOLN
INTRAMUSCULAR | Status: DC | PRN
  Administered 2022-08-25 (×2): 50 ug via INTRAVENOUS

## 2022-08-25 MED ORDER — ONDANSETRON HCL 4 MG/2ML IJ SOLN
INTRAMUSCULAR | Status: AC
  Filled 2022-08-25: qty 2

## 2022-08-25 MED ORDER — SODIUM CHLORIDE 0.9 % IV SOLN
INTRAVENOUS | Status: DC | PRN
  Administered 2022-08-25: 35 mL

## 2022-08-25 MED ORDER — SUGAMMADEX SODIUM 200 MG/2ML IV SOLN
INTRAVENOUS | Status: DC | PRN
  Administered 2022-08-25: 200 mg via INTRAVENOUS

## 2022-08-25 NOTE — Progress Notes (Signed)
[facility] PROGRESS NOTE    Progress Note  DEV DHONDT  [id number] DOB: 1950-11-05 DOA: 08/24/2022 PCP: Wenda Low, MD     Brief Narrative:   Kyle Rogers is an 71 y.o. male past medical history significant for type 2 diabetes mellitus, essential hypertension comes in for 4 days of abdominal pain worse after eating went to see his PCP LFTs were significantly elevated, was also having fevers sent to the ED, CT scan showed gallbladder wall thickening, gallbladder is and concern for acute cholecystitis was started on IV fluids IV antibiotics GI was consulted At [facility] who recommended the patient be transferred to [facility] for possible ERCP, Carol Ada to see.   Assessment/Plan:   Severe sepsis secondary to Acute cholecystitis: Started empirically on IV fluids IV antibiotics and fluid resuscitated. Blood cultures have been ordered which are negative this morning. Urine cultures pending, Tmax of 100.6, leukocytosis improved. Currently on Rocephin and Flagyl. Abdominal ultrasound is pending. GI consulted.  Relates his abdominal pain is significantly improved. LFTs remain about the same, bilirubin is improving.  Lactic acidosis has resolved.  Normocytic anemia: He denies any melanotic stools or bright red blood per rectum. He will see feels tired. Will transfuse 1 unit packed red blood cells check a CBC posttransfusion  Elevated LFT's: Likely due to above.  Diabetes mellitus type 2 uncontrolled with hyperglycemia: Currently n.p.o. with sliding scale insulin, blood glucose relatively controlled.  Essential hypertension: Blood pressure soft hold all antihypertensive medication.  Hyperlipidemia: Noted.  Chronic kidney disease stage IV Creatinine appears to be at baseline.    DVT prophylaxis: lovenox Family Communication:none Status is: Inpatient Remains inpatient appropriate because: Acute cholecystitis    Code Status:     Code Status Orders   (From admission, onward)           Start     Ordered   08/25/22 0028  Full code  Continuous        08/25/22 0027           Code Status History     This patient has a current code status but no historical code status.         IV Access:   Peripheral IV   Procedures and diagnostic studies:   DG Chest Port 1 View  Result Date: 08/24/2022 CLINICAL DATA:  Sepsis, abnormal liver function tests EXAM: PORTABLE CHEST 1 VIEW COMPARISON:  None Available. FINDINGS: Single frontal view of the chest demonstrates an unremarkable cardiac silhouette. No airspace disease, effusion, or pneumothorax. No acute bony abnormalities. IMPRESSION: 1. No acute intrathoracic process. Electronically Signed   By: Randa Ngo M.D.   On: 08/24/2022 19:41   CT ABDOMEN PELVIS WO CONTRAST  Result Date: 08/24/2022 CLINICAL DATA:  Epigastric pain EXAM: CT ABDOMEN AND PELVIS WITHOUT CONTRAST TECHNIQUE: Multidetector CT imaging of the abdomen and pelvis was performed following the standard protocol without IV contrast. RADIATION DOSE REDUCTION: This exam was performed according to the departmental dose-optimization program which includes automated exposure control, adjustment of the mA and/or kV according to patient size and/or use of iterative reconstruction technique. COMPARISON:  None Available. FINDINGS: Lower chest: Scarring in the lung bases. No acute findings. Calcifications in the visualized coronary arteries. Hepatobiliary: Gallbladder wall is indistinct and thickened. Probable stones within the gallbladder. Appearance is concerning for acute cholecystitis. No focal hepatic abnormality. Pancreas: No focal abnormality or ductal dilatation. Spleen: No focal abnormality.  Normal size. Adrenals/Urinary Tract: No adrenal abnormality. No focal renal abnormality. No  stones or hydronephrosis. Urinary bladder is unremarkable. Stomach/Bowel: Stomach, large and small bowel grossly unremarkable. Normal appendix.  Vascular/Lymphatic: Aortic atherosclerosis. No evidence of aneurysm or adenopathy. Reproductive: Radiation seeds in the region of the prostate. No visible focal abnormality. Other: No free fluid or free air. Musculoskeletal: No acute bony abnormality. IMPRESSION: Gallbladder wall appears thickened and indistinct. Suspect gallstones within the gallbladder. Findings are concerning for acute cholecystitis. This could be further evaluated with right upper quadrant ultrasound if clinically indicated. Coronary artery disease, aortic atherosclerosis. Electronically Signed   By: Rolm Baptise M.D.   On: 08/24/2022 19:39     Medical Consultants:   None.   Subjective:    [name] relates his abdominal pain is better he feels tired and fatigued, feels better than yesterday  Objective:    Vitals:   08/24/22 2130 08/24/22 2200 08/25/22 0002 08/25/22 0635  BP: (!) 109/57 106/66 (!) 104/54 (!) 91/54  Pulse: 78 85 81 72  Resp: (!) '22 20 18 18  '$ Temp:  99.2 F (37.3 C) 97.8 F (36.6 C) 97.6 F (36.4 C)  TempSrc:   Oral Oral  SpO2: 90% 95% 99% 97%  Weight:      Height:       SpO2: 97 %  No intake or output data in the 24 hours ending 08/25/22 0651 Filed Weights   08/24/22 1748  Weight: 67.6 kg    Exam: General exam: In no acute distress. Respiratory system: Good air movement and clear to auscultation. Cardiovascular system: S1 & S2 heard, RRR. No JVD. Gastrointestinal system: Abdomen is nondistended, soft and nontender.  Extremities: No pedal edema. Skin: No rashes, lesions or ulcers Psychiatry: Judgement and insight appear normal. Mood & affect appropriate.    Data Reviewed:    Labs: Basic Metabolic Panel: Recent Labs  Lab 08/24/22 1812 08/25/22 0208  NA 136 139  K 3.7 3.9  CL 101 104  CO2 19* 21*  GLUCOSE 211* 190*  BUN 45* 52*  CREATININE 3.18* 3.27*  CALCIUM 8.0* 7.9*  MG  --  1.3*  PHOS  --  5.7*   GFR Estimated Creatinine Clearance: 18.7 mL/min (A) (by  C-G formula based on SCr of 3.27 mg/dL (H)). Liver Function Tests: Recent Labs  Lab 08/24/22 1812 08/25/22 0208  AST 512* 527*  ALT 269* 232*  ALKPHOS 259* 197*  BILITOT 5.6* 4.4*  PROT 7.0 5.6*  ALBUMIN 3.3* 2.5*   Recent Labs  Lab 08/24/22 1812  LIPASE 32   No results for input(s): "AMMONIA" in the last 168 hours. Coagulation profile Recent Labs  Lab 08/24/22 2000  INR 1.5*   COVID-19 Labs  No results for input(s): "DDIMER", "FERRITIN", "LDH", "CRP" in the last 72 hours.  Lab Results  Component Value Date   SARSCOV2NAA NEGATIVE 08/24/2022    CBC: Recent Labs  Lab 08/24/22 1812 08/25/22 0208  WBC 17.1* 11.1*  NEUTROABS 16.5*  --   HGB 8.3* 7.0*  HCT 24.3* 19.3*  MCV 98.4 95.5  PLT 159 103*   Cardiac Enzymes: No results for input(s): "CKTOTAL", "CKMB", "CKMBINDEX", "TROPONINI" in the last 168 hours. BNP (last 3 results) No results for input(s): "PROBNP" in the last 8760 hours. CBG: No results for input(s): "GLUCAP" in the last 168 hours. D-Dimer: No results for input(s): "DDIMER" in the last 72 hours. Hgb A1c: No results for input(s): "HGBA1C" in the last 72 hours. Lipid Profile: No results for input(s): "CHOL", "HDL", "LDLCALC", "TRIG", "CHOLHDL", "LDLDIRECT" in the last 72 hours.  Thyroid function studies: No results for input(s): "TSH", "T4TOTAL", "T3FREE", "THYROIDAB" in the last 72 hours.  Invalid input(s): "FREET3" Anemia work up: No results for input(s): "VITAMINB12", "FOLATE", "FERRITIN", "TIBC", "IRON", "RETICCTPCT" in the last 72 hours. Sepsis Labs: Recent Labs  Lab 08/24/22 1812 08/24/22 2014 08/25/22 0208  WBC 17.1*  --  11.1*  LATICACIDVEN 3.8* 3.0* 1.6   Microbiology Recent Results (from the past 240 hour(s))  Blood Culture (routine x 2)     Status: None (Preliminary result)   Collection Time: 08/24/22  8:00 PM   Specimen: Left Antecubital; Blood  Result Value Ref Range Status   Specimen Description   Final    LEFT  ANTECUBITAL BOTTLES DRAWN AEROBIC AND ANAEROBIC   Special Requests Blood Culture adequate volume  Final   Culture   Final    NO GROWTH < 12 HOURS Performed at [facility], [address], [place], [place] [address]    Report Status PENDING  Incomplete  Blood Culture (routine x 2)     Status: None (Preliminary result)   Collection Time: 08/24/22  8:14 PM   Specimen: BLOOD LEFT HAND  Result Value Ref Range Status   Specimen Description   Final    BLOOD LEFT HAND BOTTLES DRAWN AEROBIC AND ANAEROBIC   Special Requests Blood Culture adequate volume  Final   Culture   Final    NO GROWTH < 12 HOURS Performed at [facility], [address], [place], [place] [address]    Report Status PENDING  Incomplete  Resp Panel by RT-PCR (Flu A&B, Covid) Anterior Nasal Swab     Status: None   Collection Time: 08/24/22  8:53 PM   Specimen: Anterior Nasal Swab  Result Value Ref Range Status   SARS Coronavirus 2 by RT PCR NEGATIVE NEGATIVE Final    Comment: (NOTE) SARS-CoV-2 target nucleic acids are NOT DETECTED.  The SARS-CoV-2 RNA is generally detectable in upper respiratory specimens during the acute phase of infection. The lowest concentration of SARS-CoV-2 viral copies this assay can detect is 138 copies/mL. A negative result does not preclude SARS-Cov-2 infection and should not be used as the sole basis for treatment or other patient management decisions. A negative result may occur with  improper specimen collection/handling, submission of specimen other than nasopharyngeal swab, presence of viral mutation(s) within the areas targeted by this assay, and inadequate number of viral copies(<138 copies/mL). A negative result must be combined with clinical observations, patient history, and epidemiological information. The expected result is Negative.  Fact Sheet for Patients:  EntrepreneurPulse.com.au  Fact Sheet for Healthcare Providers:   IncredibleEmployment.be  This test is no t yet approved or cleared by the [place] FDA and  has been authorized for detection and/or diagnosis of SARS-CoV-2 by FDA under an Emergency Use Authorization (EUA). This EUA will remain  in effect (meaning this test can be used) for the duration of the COVID-19 declaration under Section 564(b)(1) of the Act, 21 U.S.C.section 360bbb-3(b)(1), unless the authorization is terminated  or revoked sooner.       Influenza A by PCR NEGATIVE NEGATIVE Final   Influenza B by PCR NEGATIVE NEGATIVE Final    Comment: (NOTE) The Xpert Xpress SARS-CoV-2/FLU/RSV plus assay is intended as an aid in the diagnosis of influenza from Nasopharyngeal swab specimens and should not be used as a sole basis for treatment. Nasal washings and aspirates are unacceptable for Xpert Xpress SARS-CoV-2/FLU/RSV testing.  Fact Sheet for Patients: EntrepreneurPulse.com.au  Fact Sheet for Healthcare Providers:  IncredibleEmployment.be  This test is not yet approved or cleared by the [place] and has been authorized for detection and/or diagnosis of SARS-CoV-2 by FDA under an Emergency Use Authorization (EUA). This EUA will remain in effect (meaning this test can be used) for the duration of the COVID-19 declaration under Section 564(b)(1) of the Act, 21 U.S.C. section 360bbb-3(b)(1), unless the authorization is terminated or revoked.  Performed at [facility], [address] [place], [place] [address]      Medications:    insulin aspart  0-5 Units Subcutaneous QHS   insulin aspart  0-9 Units Subcutaneous TID WC   insulin glargine-yfgn  8 Units Subcutaneous QHS   Continuous Infusions:  cefTRIAXone (ROCEPHIN)  IV     lactated ringers 125 mL/hr at 08/25/22 0213   metronidazole        LOS: 1 day   Charlynne Cousins  [facility]  08/25/2022, 6:51 AM

## 2022-08-25 NOTE — Progress Notes (Signed)
Blood cultures results done at [facility] positive for gram negative rods, MD notified.

## 2022-08-25 NOTE — Consult Note (Signed)
Reason for Consult: Choledocholithiasis Referring Physician: [facility]  Felicie Morn HPI: This is a 71 year old male with a PMH of DM, HTN, and hyperlipidemia admitted for cholangitis and cholecystitis.  The patient was feeling poorly for several days before admission.  He complained about post prandial upper abdominal pain.  His PCP checked blood work and he was noted to have an obstructive pattern.  He was instructed to present to the ER at [facility].  Work up revealed cholelithiasis, cholecystitis, and choledocholithiasis.  His liver enzymes were as follows:  AST 512, ALT 269, AP 259, and TB 5.6.  His feels better with pain control today.  His liver enzymes did mildly improve overnight.  His WBC declined from 17.1 down to 11.1 with antibiotics.  Of note was his HGB, it was at 7.0.  He reports having a chronic history of anemia, but this is the lowest value.  Prior work up with Hematology did not yield a diagnosis or treatment.  The patient denies any overt bleeding.    Past Medical History:  Diagnosis Date   Anemia    CKD (chronic kidney disease), stage III ([place])    Diabetes mellitus without complication ([place])    History of hepatitis C 03/19/2022   History of tongue cancer 03/19/2022   Hyperlipidemia    Hypertension    Malignant neoplasm of prostate ([place]) 04/25/2021   Thrombocytopenia ([place]) 03/19/2022    Past Surgical History:  Procedure Laterality Date   PROSTATE BIOPSY     tongue surgery      History reviewed. No pertinent family history.  Social History:  reports that he has never smoked. He has never been exposed to tobacco smoke. He has never used smokeless tobacco. He reports that he does not currently use alcohol. He reports that he does not currently use drugs.  Allergies: No Known Allergies  Medications: Scheduled:  insulin aspart  0-5 Units Subcutaneous QHS   insulin aspart  0-9 Units Subcutaneous TID WC   insulin glargine-yfgn  8 Units Subcutaneous QHS    Continuous:  cefTRIAXone (ROCEPHIN)  IV     lactated ringers Stopped (08/25/22 1059)   metronidazole 500 mg (08/25/22 0902)    Results for orders placed or performed during the hospital encounter of 08/24/22 (from the past 24 hour(s))  Comprehensive metabolic panel     Status: Abnormal   Collection Time: 08/24/22  6:12 PM  Result Value Ref Range   Sodium 136 135 - 145 mmol/L   Potassium 3.7 3.5 - 5.1 mmol/L   Chloride 101 98 - 111 mmol/L   CO2 19 (L) 22 - 32 mmol/L   Glucose, Bld 211 (H) 70 - 99 mg/dL   BUN 45 (H) 8 - 23 mg/dL   Creatinine, Ser 3.18 (H) 0.61 - 1.24 mg/dL   Calcium 8.0 (L) 8.9 - 10.3 mg/dL   Total Protein 7.0 6.5 - 8.1 g/dL   Albumin 3.3 (L) 3.5 - 5.0 g/dL   AST 512 (H) 15 - 41 U/L   ALT 269 (H) 0 - 44 U/L   Alkaline Phosphatase 259 (H) 38 - 126 U/L   Total Bilirubin 5.6 (H) 0.3 - 1.2 mg/dL   GFR, Estimated 20 (L) >60 mL/min   Anion gap 16 (H) 5 - 15  CBC with Differential     Status: Abnormal   Collection Time: 08/24/22  6:12 PM  Result Value Ref Range   WBC 17.1 (H) 4.0 - 10.5 K/uL   RBC 2.47 (L)  4.22 - 5.81 MIL/uL   Hemoglobin 8.3 (L) 13.0 - 17.0 g/dL   HCT 24.3 (L) 39.0 - 52.0 %   MCV 98.4 80.0 - 100.0 fL   MCH 33.6 26.0 - 34.0 pg   MCHC 34.2 30.0 - 36.0 g/dL   RDW 14.6 11.5 - 15.5 %   Platelets 159 150 - 400 K/uL   nRBC 0.0 0.0 - 0.2 %   Neutrophils Relative % 97 %   Neutro Abs 16.5 (H) 1.7 - 7.7 K/uL   Lymphocytes Relative 1 %   Lymphs Abs 0.1 (L) 0.7 - 4.0 K/uL   Monocytes Relative 1 %   Monocytes Absolute 0.2 0.1 - 1.0 K/uL   Eosinophils Relative 0 %   Eosinophils Absolute 0.0 0.0 - 0.5 K/uL   Basophils Relative 0 %   Basophils Absolute 0.0 0.0 - 0.1 K/uL   Immature Granulocytes 1 %   Abs Immature Granulocytes 0.23 (H) 0.00 - 0.07 K/uL  Lipase, blood     Status: None   Collection Time: 08/24/22  6:12 PM  Result Value Ref Range   Lipase 32 11 - 51 U/L  Lactic acid, plasma     Status: Abnormal   Collection Time: 08/24/22  6:12 PM   Result Value Ref Range   Lactic Acid, Venous 3.8 (HH) 0.5 - 1.9 mmol/L  Protime-INR     Status: Abnormal   Collection Time: 08/24/22  8:00 PM  Result Value Ref Range   Prothrombin Time 18.2 (H) 11.4 - 15.2 seconds   INR 1.5 (H) 0.8 - 1.2  APTT     Status: Abnormal   Collection Time: 08/24/22  8:00 PM  Result Value Ref Range   aPTT 42 (H) 24 - 36 seconds  Blood Culture (routine x 2)     Status: None (Preliminary result)   Collection Time: 08/24/22  8:00 PM   Specimen: Left Antecubital; Blood  Result Value Ref Range   Specimen Description      LEFT ANTECUBITAL BOTTLES DRAWN AEROBIC AND ANAEROBIC   Special Requests Blood Culture adequate volume    Culture      NO GROWTH < 12 HOURS Performed at [facility], [address] [place], [place] [address]    Report Status PENDING   Lactic acid, plasma     Status: Abnormal   Collection Time: 08/24/22  8:14 PM  Result Value Ref Range   Lactic Acid, Venous 3.0 (HH) 0.5 - 1.9 mmol/L  Blood Culture (routine x 2)     Status: None (Preliminary result)   Collection Time: 08/24/22  8:14 PM   Specimen: BLOOD LEFT HAND  Result Value Ref Range   Specimen Description      BLOOD LEFT HAND BOTTLES DRAWN AEROBIC AND ANAEROBIC   Special Requests Blood Culture adequate volume    Culture      NO GROWTH < 12 HOURS Performed at [facility], [address] [place], [place] [address]    Report Status PENDING   Resp Panel by RT-PCR (Flu A&B, Covid) Anterior Nasal Swab     Status: None   Collection Time: 08/24/22  8:53 PM   Specimen: Anterior Nasal Swab  Result Value Ref Range   SARS Coronavirus 2 by RT PCR NEGATIVE NEGATIVE   Influenza A by PCR NEGATIVE NEGATIVE   Influenza B by PCR NEGATIVE NEGATIVE  Urinalysis, Routine w reflex microscopic Urine, Clean Catch     Status: Abnormal   Collection Time: 08/24/22  8:53 PM  Result Value  Ref Range   Color, Urine AMBER (A) YELLOW   APPearance HAZY (A) CLEAR   Specific Gravity, Urine 1.022 1.005 - 1.030    pH 5.0 5.0 - 8.0   Glucose, UA >=500 (A) NEGATIVE mg/dL   Hgb urine dipstick LARGE (A) NEGATIVE   Bilirubin Urine NEGATIVE NEGATIVE   Ketones, ur 5 (A) NEGATIVE mg/dL   Protein, ur >=300 (A) NEGATIVE mg/dL   Nitrite NEGATIVE NEGATIVE   Leukocytes,Ua NEGATIVE NEGATIVE   RBC / HPF 0-5 0 - 5 RBC/hpf   WBC, UA 0-5 0 - 5 WBC/hpf   Bacteria, UA NONE SEEN NONE SEEN   Squamous Epithelial / LPF 0-5 0 - 5  Lactic acid, plasma     Status: None   Collection Time: 08/25/22  2:08 AM  Result Value Ref Range   Lactic Acid, Venous 1.6 0.5 - 1.9 mmol/L  Magnesium     Status: Abnormal   Collection Time: 08/25/22  2:08 AM  Result Value Ref Range   Magnesium 1.3 (L) 1.7 - 2.4 mg/dL  Phosphorus     Status: Abnormal   Collection Time: 08/25/22  2:08 AM  Result Value Ref Range   Phosphorus 5.7 (H) 2.5 - 4.6 mg/dL  Comprehensive metabolic panel     Status: Abnormal   Collection Time: 08/25/22  2:08 AM  Result Value Ref Range   Sodium 139 135 - 145 mmol/L   Potassium 3.9 3.5 - 5.1 mmol/L   Chloride 104 98 - 111 mmol/L   CO2 21 (L) 22 - 32 mmol/L   Glucose, Bld 190 (H) 70 - 99 mg/dL   BUN 52 (H) 8 - 23 mg/dL   Creatinine, Ser 3.27 (H) 0.61 - 1.24 mg/dL   Calcium 7.9 (L) 8.9 - 10.3 mg/dL   Total Protein 5.6 (L) 6.5 - 8.1 g/dL   Albumin 2.5 (L) 3.5 - 5.0 g/dL   AST 527 (H) 15 - 41 U/L   ALT 232 (H) 0 - 44 U/L   Alkaline Phosphatase 197 (H) 38 - 126 U/L   Total Bilirubin 4.4 (H) 0.3 - 1.2 mg/dL   GFR, Estimated 19 (L) >60 mL/min   Anion gap 14 5 - 15  CBC     Status: Abnormal   Collection Time: 08/25/22  2:08 AM  Result Value Ref Range   WBC 11.1 (H) 4.0 - 10.5 K/uL   RBC 2.02 (L) 4.22 - 5.81 MIL/uL   Hemoglobin 7.0 (L) 13.0 - 17.0 g/dL   HCT 19.3 (L) 39.0 - 52.0 %   MCV 95.5 80.0 - 100.0 fL   MCH 34.7 (H) 26.0 - 34.0 pg   MCHC 36.3 (H) 30.0 - 36.0 g/dL   RDW 14.6 11.5 - 15.5 %   Platelets 103 (L) 150 - 400 K/uL   nRBC 0.0 0.0 - 0.2 %  Type and screen [place]      Status: None (Preliminary result)   Collection Time: 08/25/22  8:37 AM  Result Value Ref Range   ABO/RH(D) B POS    Antibody Screen NEG    Sample Expiration 08/28/2022,2359    Unit Number [id number]    Blood Component Type RED CELLS,LR    Unit division 00    Status of Unit ISSUED    Transfusion Status OK TO TRANSFUSE    Crossmatch Result      Compatible Performed at [facility], [address] [place], [place] [address]   Prepare RBC (crossmatch)  Status: None   Collection Time: 08/25/22  8:40 AM  Result Value Ref Range   Order Confirmation      ORDER PROCESSED BY BLOOD BANK Performed at [facility], [place] [address], [place], [place] [address]   Glucose, capillary     Status: Abnormal   Collection Time: 08/25/22  8:44 AM  Result Value Ref Range   Glucose-Capillary 136 (H) 70 - 99 mg/dL     US Abdomen Limited  Result Date: 08/25/2022 CLINICAL DATA:  71 year old male with history of transaminitis and sepsis. EXAM: ULTRASOUND ABDOMEN LIMITED RIGHT UPPER QUADRANT COMPARISON:  CT abdomen pelvis from 08/24/2022 FINDINGS: Gallbladder: Diffuse thickening of gallbladder wall measuring up to 0.8 cm. No significant pericholecystic fluid. No significant gallbladder distension. Layering sludge in the infundibulum. No evidence of definite cholelithiasis. No sonographic Murphy sign. Common bile duct: Diameter: 1.1 cm. Liver: No focal lesion identified. Within normal limits in parenchymal echogenicity and echotexture. Portal vein is patent on color Doppler imaging with normal direction of blood flow towards the liver. Other: No perihepatic ascites. IMPRESSION: 1. Gallbladder wall thickening and layering sludge without evidence of significant distension or cholelithiasis. These findings are unlikely to represent acute cholecystitis. 2. Common bile duct dilation without evidence of intrahepatic biliary ductal dilation, concerning for distal common bile duct obstructive process. 3.  Normal sonographic appearance of the hepatic parenchyma. Ruthann Cancer, MD [facility] Electronically Signed   By: Ruthann Cancer M.D.   On: 08/25/2022 09:25   CT ABDOMEN PELVIS WO CONTRAST  Addendum Date: 08/25/2022   ADDENDUM REPORT: 08/25/2022 07:33 ADDENDUM: Upon further review coronal image 46 of series 5 and axial image 38 of series 2 demonstrates a calcification at the expected level of the ampulla measuring 7 mm. This is associated with dilatation of the common bile duct estimated to measure up to 13 mm in the porta hepatis on coronal image 41 of series 5. These results were discussed by telephone at the time of interpretation on 08/25/2022 at 7:25 am with provider Dr. Benson Norway of GI, who verbally acknowledged these results. Electronically Signed   By: Vinnie Langton M.D.   On: 08/25/2022 07:33   Result Date: 08/25/2022 CLINICAL DATA:  Epigastric pain EXAM: CT ABDOMEN AND PELVIS WITHOUT CONTRAST TECHNIQUE: Multidetector CT imaging of the abdomen and pelvis was performed following the standard protocol without IV contrast. RADIATION DOSE REDUCTION: This exam was performed according to the departmental dose-optimization program which includes automated exposure control, adjustment of the mA and/or kV according to patient size and/or use of iterative reconstruction technique. COMPARISON:  None Available. FINDINGS: Lower chest: Scarring in the lung bases. No acute findings. Calcifications in the visualized coronary arteries. Hepatobiliary: Gallbladder wall is indistinct and thickened. Probable stones within the gallbladder. Appearance is concerning for acute cholecystitis. No focal hepatic abnormality. Pancreas: No focal abnormality or ductal dilatation. Spleen: No focal abnormality.  Normal size. Adrenals/Urinary Tract: No adrenal abnormality. No focal renal abnormality. No stones or hydronephrosis. Urinary bladder is unremarkable. Stomach/Bowel:  Stomach, large and small bowel grossly unremarkable. Normal appendix. Vascular/Lymphatic: Aortic atherosclerosis. No evidence of aneurysm or adenopathy. Reproductive: Radiation seeds in the region of the prostate. No visible focal abnormality. Other: No free fluid or free air. Musculoskeletal: No acute bony abnormality. IMPRESSION: Gallbladder wall appears thickened and indistinct. Suspect gallstones within the gallbladder. Findings are concerning for acute cholecystitis. This could be further evaluated with right upper quadrant ultrasound if clinically indicated. Coronary artery disease, aortic atherosclerosis.  Electronically Signed: By: Rolm Baptise M.D. On: 08/24/2022 19:39   DG Chest Port 1 View  Result Date: 08/24/2022 CLINICAL DATA:  Sepsis, abnormal liver function tests EXAM: PORTABLE CHEST 1 VIEW COMPARISON:  None Available. FINDINGS: Single frontal view of the chest demonstrates an unremarkable cardiac silhouette. No airspace disease, effusion, or pneumothorax. No acute bony abnormalities. IMPRESSION: 1. No acute intrathoracic process. Electronically Signed   By: Randa Ngo M.D.   On: 08/24/2022 19:41    ROS:  As stated above in the HPI otherwise negative.  Blood pressure 91/75, pulse 72, temperature 98.9 F (37.2 C), temperature source Oral, resp. rate 16, height '5\' 6"'$  (1.676 m), weight 67.6 kg, SpO2 95 %.    PE: Gen: NAD, Alert and Oriented HEENT:  [place]/AT, EOMI Neck: Supple, no LAD Lungs: CTA Bilaterally CV: RRR without M/G/R ABD: Soft, NTND, +BS Ext: No C/C/E  Assessment/Plan: 1) Choledocholithiasis with an ascending cholangitis. 2) Cholecystitis. 3) CKD/AKI   He is clinically stable and he responded to antibiotic treatment.  After speaking with Radiology his CBD was measured to be 1.2 cm and there was an impacted stone at the ampulla.  Plan: 1) ERCP with stone extraction.  Dicy Smigel D 08/25/2022, 11:05 AM

## 2022-08-25 NOTE — H&P (View-Only) (Signed)
Reason for Consult: Choledocholithiasis Referring Physician: [facility]  Felicie Morn HPI: This is a 71 year old male with a PMH of DM, HTN, and hyperlipidemia admitted for cholangitis and cholecystitis.  The patient was feeling poorly for several days before admission.  He complained about post prandial upper abdominal pain.  His PCP checked blood work and he was noted to have an obstructive pattern.  He was instructed to present to the ER at [facility].  Work up revealed cholelithiasis, cholecystitis, and choledocholithiasis.  His liver enzymes were as follows:  AST 512, ALT 269, AP 259, and TB 5.6.  His feels better with pain control today.  His liver enzymes did mildly improve overnight.  His WBC declined from 17.1 down to 11.1 with antibiotics.  Of note was his HGB, it was at 7.0.  He reports having a chronic history of anemia, but this is the lowest value.  Prior work up with Hematology did not yield a diagnosis or treatment.  The patient denies any overt bleeding.    Past Medical History:  Diagnosis Date   Anemia    CKD (chronic kidney disease), stage III ([place])    Diabetes mellitus without complication ([place])    History of hepatitis C 03/19/2022   History of tongue cancer 03/19/2022   Hyperlipidemia    Hypertension    Malignant neoplasm of prostate ([place]) 04/25/2021   Thrombocytopenia ([place]) 03/19/2022    Past Surgical History:  Procedure Laterality Date   PROSTATE BIOPSY     tongue surgery      History reviewed. No pertinent family history.  Social History:  reports that he has never smoked. He has never been exposed to tobacco smoke. He has never used smokeless tobacco. He reports that he does not currently use alcohol. He reports that he does not currently use drugs.  Allergies: No Known Allergies  Medications: Scheduled:  insulin aspart  0-5 Units Subcutaneous QHS   insulin aspart  0-9 Units Subcutaneous TID WC   insulin glargine-yfgn  8 Units Subcutaneous QHS    Continuous:  cefTRIAXone (ROCEPHIN)  IV     lactated ringers Stopped (08/25/22 1059)   metronidazole 500 mg (08/25/22 0902)    Results for orders placed or performed during the hospital encounter of 08/24/22 (from the past 24 hour(s))  Comprehensive metabolic panel     Status: Abnormal   Collection Time: 08/24/22  6:12 PM  Result Value Ref Range   Sodium 136 135 - 145 mmol/L   Potassium 3.7 3.5 - 5.1 mmol/L   Chloride 101 98 - 111 mmol/L   CO2 19 (L) 22 - 32 mmol/L   Glucose, Bld 211 (H) 70 - 99 mg/dL   BUN 45 (H) 8 - 23 mg/dL   Creatinine, Ser 3.18 (H) 0.61 - 1.24 mg/dL   Calcium 8.0 (L) 8.9 - 10.3 mg/dL   Total Protein 7.0 6.5 - 8.1 g/dL   Albumin 3.3 (L) 3.5 - 5.0 g/dL   AST 512 (H) 15 - 41 U/L   ALT 269 (H) 0 - 44 U/L   Alkaline Phosphatase 259 (H) 38 - 126 U/L   Total Bilirubin 5.6 (H) 0.3 - 1.2 mg/dL   GFR, Estimated 20 (L) >60 mL/min   Anion gap 16 (H) 5 - 15  CBC with Differential     Status: Abnormal   Collection Time: 08/24/22  6:12 PM  Result Value Ref Range   WBC 17.1 (H) 4.0 - 10.5 K/uL   RBC 2.47 (L)  4.22 - 5.81 MIL/uL   Hemoglobin 8.3 (L) 13.0 - 17.0 g/dL   HCT 24.3 (L) 39.0 - 52.0 %   MCV 98.4 80.0 - 100.0 fL   MCH 33.6 26.0 - 34.0 pg   MCHC 34.2 30.0 - 36.0 g/dL   RDW 14.6 11.5 - 15.5 %   Platelets 159 150 - 400 K/uL   nRBC 0.0 0.0 - 0.2 %   Neutrophils Relative % 97 %   Neutro Abs 16.5 (H) 1.7 - 7.7 K/uL   Lymphocytes Relative 1 %   Lymphs Abs 0.1 (L) 0.7 - 4.0 K/uL   Monocytes Relative 1 %   Monocytes Absolute 0.2 0.1 - 1.0 K/uL   Eosinophils Relative 0 %   Eosinophils Absolute 0.0 0.0 - 0.5 K/uL   Basophils Relative 0 %   Basophils Absolute 0.0 0.0 - 0.1 K/uL   Immature Granulocytes 1 %   Abs Immature Granulocytes 0.23 (H) 0.00 - 0.07 K/uL  Lipase, blood     Status: None   Collection Time: 08/24/22  6:12 PM  Result Value Ref Range   Lipase 32 11 - 51 U/L  Lactic acid, plasma     Status: Abnormal   Collection Time: 08/24/22  6:12 PM   Result Value Ref Range   Lactic Acid, Venous 3.8 (HH) 0.5 - 1.9 mmol/L  Protime-INR     Status: Abnormal   Collection Time: 08/24/22  8:00 PM  Result Value Ref Range   Prothrombin Time 18.2 (H) 11.4 - 15.2 seconds   INR 1.5 (H) 0.8 - 1.2  APTT     Status: Abnormal   Collection Time: 08/24/22  8:00 PM  Result Value Ref Range   aPTT 42 (H) 24 - 36 seconds  Blood Culture (routine x 2)     Status: None (Preliminary result)   Collection Time: 08/24/22  8:00 PM   Specimen: Left Antecubital; Blood  Result Value Ref Range   Specimen Description      LEFT ANTECUBITAL BOTTLES DRAWN AEROBIC AND ANAEROBIC   Special Requests Blood Culture adequate volume    Culture      NO GROWTH < 12 HOURS Performed at [facility], [address], [place], [place] [address]    Report Status PENDING   Lactic acid, plasma     Status: Abnormal   Collection Time: 08/24/22  8:14 PM  Result Value Ref Range   Lactic Acid, Venous 3.0 (HH) 0.5 - 1.9 mmol/L  Blood Culture (routine x 2)     Status: None (Preliminary result)   Collection Time: 08/24/22  8:14 PM   Specimen: BLOOD LEFT HAND  Result Value Ref Range   Specimen Description      BLOOD LEFT HAND BOTTLES DRAWN AEROBIC AND ANAEROBIC   Special Requests Blood Culture adequate volume    Culture      NO GROWTH < 12 HOURS Performed at [facility], [address], [place], [place] [address]    Report Status PENDING   Resp Panel by RT-PCR (Flu A&B, Covid) Anterior Nasal Swab     Status: None   Collection Time: 08/24/22  8:53 PM   Specimen: Anterior Nasal Swab  Result Value Ref Range   SARS Coronavirus 2 by RT PCR NEGATIVE NEGATIVE   Influenza A by PCR NEGATIVE NEGATIVE   Influenza B by PCR NEGATIVE NEGATIVE  Urinalysis, Routine w reflex microscopic Urine, Clean Catch     Status: Abnormal   Collection Time: 08/24/22  8:53 PM  Result Value  Ref Range   Color, Urine AMBER (A) YELLOW   APPearance HAZY (A) CLEAR   Specific Gravity, Urine 1.022 1.005 - 1.030    pH 5.0 5.0 - 8.0   Glucose, UA >=500 (A) NEGATIVE mg/dL   Hgb urine dipstick LARGE (A) NEGATIVE   Bilirubin Urine NEGATIVE NEGATIVE   Ketones, ur 5 (A) NEGATIVE mg/dL   Protein, ur >=300 (A) NEGATIVE mg/dL   Nitrite NEGATIVE NEGATIVE   Leukocytes,Ua NEGATIVE NEGATIVE   RBC / HPF 0-5 0 - 5 RBC/hpf   WBC, UA 0-5 0 - 5 WBC/hpf   Bacteria, UA NONE SEEN NONE SEEN   Squamous Epithelial / LPF 0-5 0 - 5  Lactic acid, plasma     Status: None   Collection Time: 08/25/22  2:08 AM  Result Value Ref Range   Lactic Acid, Venous 1.6 0.5 - 1.9 mmol/L  Magnesium     Status: Abnormal   Collection Time: 08/25/22  2:08 AM  Result Value Ref Range   Magnesium 1.3 (L) 1.7 - 2.4 mg/dL  Phosphorus     Status: Abnormal   Collection Time: 08/25/22  2:08 AM  Result Value Ref Range   Phosphorus 5.7 (H) 2.5 - 4.6 mg/dL  Comprehensive metabolic panel     Status: Abnormal   Collection Time: 08/25/22  2:08 AM  Result Value Ref Range   Sodium 139 135 - 145 mmol/L   Potassium 3.9 3.5 - 5.1 mmol/L   Chloride 104 98 - 111 mmol/L   CO2 21 (L) 22 - 32 mmol/L   Glucose, Bld 190 (H) 70 - 99 mg/dL   BUN 52 (H) 8 - 23 mg/dL   Creatinine, Ser 3.27 (H) 0.61 - 1.24 mg/dL   Calcium 7.9 (L) 8.9 - 10.3 mg/dL   Total Protein 5.6 (L) 6.5 - 8.1 g/dL   Albumin 2.5 (L) 3.5 - 5.0 g/dL   AST 527 (H) 15 - 41 U/L   ALT 232 (H) 0 - 44 U/L   Alkaline Phosphatase 197 (H) 38 - 126 U/L   Total Bilirubin 4.4 (H) 0.3 - 1.2 mg/dL   GFR, Estimated 19 (L) >60 mL/min   Anion gap 14 5 - 15  CBC     Status: Abnormal   Collection Time: 08/25/22  2:08 AM  Result Value Ref Range   WBC 11.1 (H) 4.0 - 10.5 K/uL   RBC 2.02 (L) 4.22 - 5.81 MIL/uL   Hemoglobin 7.0 (L) 13.0 - 17.0 g/dL   HCT 19.3 (L) 39.0 - 52.0 %   MCV 95.5 80.0 - 100.0 fL   MCH 34.7 (H) 26.0 - 34.0 pg   MCHC 36.3 (H) 30.0 - 36.0 g/dL   RDW 14.6 11.5 - 15.5 %   Platelets 103 (L) 150 - 400 K/uL   nRBC 0.0 0.0 - 0.2 %  Type and screen [place]      Status: None (Preliminary result)   Collection Time: 08/25/22  8:37 AM  Result Value Ref Range   ABO/RH(D) B POS    Antibody Screen NEG    Sample Expiration 08/28/2022,2359    Unit Number [id number]    Blood Component Type RED CELLS,LR    Unit division 00    Status of Unit ISSUED    Transfusion Status OK TO TRANSFUSE    Crossmatch Result      Compatible Performed at [facility], [address], [place], [place] [address]   Prepare RBC (crossmatch)  Status: None   Collection Time: 08/25/22  8:40 AM  Result Value Ref Range   Order Confirmation      ORDER PROCESSED BY BLOOD BANK Performed at [facility], [place] [address], [place], [place] [address]   Glucose, capillary     Status: Abnormal   Collection Time: 08/25/22  8:44 AM  Result Value Ref Range   Glucose-Capillary 136 (H) 70 - 99 mg/dL     US Abdomen Limited  Result Date: 08/25/2022 CLINICAL DATA:  71 year old male with history of transaminitis and sepsis. EXAM: ULTRASOUND ABDOMEN LIMITED RIGHT UPPER QUADRANT COMPARISON:  CT abdomen pelvis from 08/24/2022 FINDINGS: Gallbladder: Diffuse thickening of gallbladder wall measuring up to 0.8 cm. No significant pericholecystic fluid. No significant gallbladder distension. Layering sludge in the infundibulum. No evidence of definite cholelithiasis. No sonographic Murphy sign. Common bile duct: Diameter: 1.1 cm. Liver: No focal lesion identified. Within normal limits in parenchymal echogenicity and echotexture. Portal vein is patent on color Doppler imaging with normal direction of blood flow towards the liver. Other: No perihepatic ascites. IMPRESSION: 1. Gallbladder wall thickening and layering sludge without evidence of significant distension or cholelithiasis. These findings are unlikely to represent acute cholecystitis. 2. Common bile duct dilation without evidence of intrahepatic biliary ductal dilation, concerning for distal common bile duct obstructive process. 3.  Normal sonographic appearance of the hepatic parenchyma. Ruthann Cancer, MD [facility] Radiology Electronically Signed   By: Ruthann Cancer M.D.   On: 08/25/2022 09:25   CT ABDOMEN PELVIS WO CONTRAST  Addendum Date: 08/25/2022   ADDENDUM REPORT: 08/25/2022 07:33 ADDENDUM: Upon further review coronal image 46 of series 5 and axial image 38 of series 2 demonstrates a calcification at the expected level of the ampulla measuring 7 mm. This is associated with dilatation of the common bile duct estimated to measure up to 13 mm in the porta hepatis on coronal image 41 of series 5. These results were discussed by telephone at the time of interpretation on 08/25/2022 at 7:25 am with provider Dr. Benson Norway of GI, who verbally acknowledged these results. Electronically Signed   By: Vinnie Langton M.D.   On: 08/25/2022 07:33   Result Date: 08/25/2022 CLINICAL DATA:  Epigastric pain EXAM: CT ABDOMEN AND PELVIS WITHOUT CONTRAST TECHNIQUE: Multidetector CT imaging of the abdomen and pelvis was performed following the standard protocol without IV contrast. RADIATION DOSE REDUCTION: This exam was performed according to the departmental dose-optimization program which includes automated exposure control, adjustment of the mA and/or kV according to patient size and/or use of iterative reconstruction technique. COMPARISON:  None Available. FINDINGS: Lower chest: Scarring in the lung bases. No acute findings. Calcifications in the visualized coronary arteries. Hepatobiliary: Gallbladder wall is indistinct and thickened. Probable stones within the gallbladder. Appearance is concerning for acute cholecystitis. No focal hepatic abnormality. Pancreas: No focal abnormality or ductal dilatation. Spleen: No focal abnormality.  Normal size. Adrenals/Urinary Tract: No adrenal abnormality. No focal renal abnormality. No stones or hydronephrosis. Urinary bladder is unremarkable. Stomach/Bowel:  Stomach, large and small bowel grossly unremarkable. Normal appendix. Vascular/Lymphatic: Aortic atherosclerosis. No evidence of aneurysm or adenopathy. Reproductive: Radiation seeds in the region of the prostate. No visible focal abnormality. Other: No free fluid or free air. Musculoskeletal: No acute bony abnormality. IMPRESSION: Gallbladder wall appears thickened and indistinct. Suspect gallstones within the gallbladder. Findings are concerning for acute cholecystitis. This could be further evaluated with right upper quadrant ultrasound if clinically indicated. Coronary artery disease, aortic atherosclerosis.  Electronically Signed: By: Rolm Baptise M.D. On: 08/24/2022 19:39   DG Chest Port 1 View  Result Date: 08/24/2022 CLINICAL DATA:  Sepsis, abnormal liver function tests EXAM: PORTABLE CHEST 1 VIEW COMPARISON:  None Available. FINDINGS: Single frontal view of the chest demonstrates an unremarkable cardiac silhouette. No airspace disease, effusion, or pneumothorax. No acute bony abnormalities. IMPRESSION: 1. No acute intrathoracic process. Electronically Signed   By: Randa Ngo M.D.   On: 08/24/2022 19:41    ROS:  As stated above in the HPI otherwise negative.  Blood pressure 91/75, pulse 72, temperature 98.9 F (37.2 C), temperature source Oral, resp. rate 16, height '5\' 6"'$  (1.676 m), weight 67.6 kg, SpO2 95 %.    PE: Gen: NAD, Alert and Oriented HEENT:  [place]/AT, EOMI Neck: Supple, no LAD Lungs: CTA Bilaterally CV: RRR without M/G/R ABD: Soft, NTND, +BS Ext: No C/C/E  Assessment/Plan: 1) Choledocholithiasis with an ascending cholangitis. 2) Cholecystitis. 3) CKD/AKI   He is clinically stable and he responded to antibiotic treatment.  After speaking with Radiology his CBD was measured to be 1.2 cm and there was an impacted stone at the ampulla.  Plan: 1) ERCP with stone extraction.  Mayrin Schmuck D 08/25/2022, 11:05 AM

## 2022-08-25 NOTE — Progress Notes (Signed)
Notified by lab that patient is positive for ecoli

## 2022-08-25 NOTE — Anesthesia Preprocedure Evaluation (Addendum)
Anesthesia Evaluation  Patient identified by MRN, date of birth, ID band Patient awake    Reviewed: Allergy & Precautions, NPO status , Patient's Chart, lab work & pertinent test results, reviewed documented beta blocker date and time   History of Anesthesia Complications Negative for: history of anesthetic complications  Airway Mallampati: III   Neck ROM: Full    Dental  (+) Dental Advisory Given, Chipped   Pulmonary neg pulmonary ROS   Pulmonary exam normal        Cardiovascular hypertension, Pt. on medications and Pt. on home beta blockers Normal cardiovascular exam     Neuro/Psych negative neurological ROS  negative psych ROS   GI/Hepatic negative GI ROS,,,(+) Hepatitis -, C  Endo/Other  diabetes, Type 2, Oral Hypoglycemic Agents    Renal/GU CRFRenal disease    Prostate cancer     Musculoskeletal negative musculoskeletal ROS (+)    Abdominal   Peds  Hematology  (+) Blood dyscrasia, anemia  INR 1.5 Plt 103k    Anesthesia Other Findings   Reproductive/Obstetrics                             Anesthesia Physical Anesthesia Plan  ASA: 3  Anesthesia Plan: General   Post-op Pain Management: Minimal or no pain anticipated   Induction: Intravenous  PONV Risk Score and Plan: 2 and Treatment may vary due to age or medical condition, Ondansetron and Dexamethasone  Airway Management Planned: Oral ETT  Additional Equipment: None  Intra-op Plan:   Post-operative Plan: Extubation in OR  Informed Consent: I have reviewed the patients History and Physical, chart, labs and discussed the procedure including the risks, benefits and alternatives for the proposed anesthesia with the patient or authorized representative who has indicated his/her understanding and acceptance.     Dental advisory given  Plan Discussed with: CRNA and Anesthesiologist  Anesthesia Plan Comments: (Will  have VL immediately available)        Anesthesia Quick Evaluation

## 2022-08-25 NOTE — Op Note (Signed)
[facility] Patient Name: Kyle Rogers Procedure Date : 08/25/2022 MRN: [id number] Attending MD: Carol Ada , MD, [id number] Date of Birth: June 29, 1951 CSN: [id number] Age: 71 Admit Type: Inpatient Procedure:                ERCP Indications:              Common bile duct stone(s), For therapy of ascending                            cholangitis Providers:                Carol Ada, MD, Jeanella Cara, RN, Darliss Cheney, Technician, Luane School, CRNA Referring MD:              Medicines:                General Anesthesia, Cipro 983 mg IV Complications:            No immediate complications. Estimated Blood Loss:     Estimated blood loss: none. Procedure:                Pre-Anesthesia Assessment:                           - Prior to the procedure, a History and Physical                            was performed, and patient medications and                            allergies were reviewed. The patient's tolerance of                            previous anesthesia was also reviewed. The risks                            and benefits of the procedure and the sedation                            options and risks were discussed with the patient.                            All questions were answered, and informed consent                            was obtained. Prior Anticoagulants: The patient has                            taken no anticoagulant or antiplatelet agents. ASA                            Grade Assessment: III - A patient with severe  systemic disease. After reviewing the risks and                            benefits, the patient was deemed in satisfactory                            condition to undergo the procedure.                           - Sedation was administered by an anesthesia                            professional. General anesthesia was attained.                           After obtaining  informed consent, the scope was                            passed under direct vision. Throughout the                            procedure, the patient's blood pressure, pulse, and                            oxygen saturations were monitored continuously. The                            [id number] ([id number]) Olympus duodenoscope was                            introduced through the mouth, and used to inject                            contrast into and used to inject contrast into the                            bile duct. The ERCP was accomplished without                            difficulty. The patient tolerated the procedure                            well. Scope In: Scope Out: Findings:      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 The major papilla       was exuding mucin. The bile duct was deeply cannulated with the       short-nosed traction sphincterotome. Contrast was injected. I personally       interpreted the bile duct images. There was brisk flow of contrast       through the ducts. Image quality was excellent. Contrast extended to the       hepatic ducts. The common bile duct contained filling defect(s) thought       to be a stone. A short 0.035 inch Soft Jagwire was passed into the  biliary tree. A 10 mm biliary sphincterotomy was made with a traction       (standard) sphincterotome using ERBE electrocautery. There was no       post-sphincterotomy bleeding. The biliary tree was swept with a 12 mm       balloon starting at the bifurcation. One stone was removed. No stones       remained.      On the inferior rim of the diverticulum the ampulla was identified.       There was evidence of pus extruding from the ampulla. Cannulation of the       CBD was achieved during the first attempt with ease. The guidewire was       secured in the left intrahepatic ducts. Contrast injection revealed a       distal CBD filling defect and a dilated CBD up to 13 mm in the  proximal       CBD. A sphincterotomy was carefully created along the axis of the CBD to       avoid perforation of the diverticulum. A voluminous amount of pus       extruded from the CBD. It took several minutes for all the pus to       completely drain. While sizing up the sphincterotomy in a full bow       position the distal CBD stone was extracted. The CBD was swept three       times with the 12 mm balloon and no further stones were removed. The       final occlusion cholangiogram was negative for any retained stones. Impression:               - The major papilla was on the rim of a                            diverticulum.                           - The major papilla appeared to be exuding mucin.                           - A filling defect consistent with a stone was seen                            on the cholangiogram.                           - Choledocholithiasis was found. Complete removal                            was accomplished by biliary sphincterotomy and                            balloon extraction.                           - A biliary sphincterotomy was performed.                           - The biliary tree was swept. Recommendation:           -  Return patient to hospital ward for ongoing care.                           - Clear liquid diet.                           - Surgical consultation for lap chole. Procedure Code(s):        --- Professional ---                           [phone], Endoscopic retrograde                            cholangiopancreatography (ERCP); with removal of                            calculi/debris from biliary/pancreatic duct(s)                           43262, Endoscopic retrograde                            cholangiopancreatography (ERCP); with                            sphincterotomy/papillotomy                           [phone], Endoscopic catheterization of the biliary                            ductal system, radiological supervision  and                            interpretation Diagnosis Code(s):        --- Professional ---                           K80.30, Calculus of bile duct with cholangitis,                            unspecified, without obstruction                           K83.09, Other cholangitis                           K83.8, Other specified diseases of biliary tract                           R93.2, Abnormal findings on diagnostic imaging of                            liver and biliary tract CPT copyright 2022 [employer]. All rights reserved. The codes documented in this report are preliminary and upon coder review may  be revised to meet current compliance requirements. Carol Ada, MD Carol Ada, MD 08/25/2022 12:54:27 PM This report has been signed electronically.  Number of Addenda: 0 

## 2022-08-25 NOTE — Interval H&P Note (Signed)
History and Physical Interval Note:  08/25/2022 11:58 AM  Kyle Rogers  has presented today for surgery, with the diagnosis of Choledocholithiasis.  The various methods of treatment have been discussed with the patient and family. After consideration of risks, benefits and other options for treatment, the patient has consented to  Procedure(s): ENDOSCOPIC RETROGRADE CHOLANGIOPANCREATOGRAPHY (ERCP) (N/A) as a surgical intervention.  The patient's history has been reviewed, patient examined, no change in status, stable for surgery.  I have reviewed the patient's chart and labs.  Questions were answered to the patient's satisfaction.     [name] D

## 2022-08-25 NOTE — Transfer of Care (Signed)
Immediate Anesthesia Transfer of Care Note  Patient: Kyle Rogers  Procedure(s) Performed: ENDOSCOPIC RETROGRADE CHOLANGIOPANCREATOGRAPHY (ERCP) REMOVAL OF STONES SPHINCTEROTOMY  Patient Location: PACU  Anesthesia Type:General  Level of Consciousness: drowsy and patient cooperative  Airway & Oxygen Therapy: Patient Spontanous Breathing and Patient connected to nasal cannula oxygen  Post-op Assessment: Report given to RN and Post -op Vital signs reviewed and stable  Post vital signs: Reviewed and stable  Last Vitals:  Vitals Value Taken Time  BP 109/64 08/25/22 1300  Temp 36.7 C 08/25/22 1250  Pulse 75 08/25/22 1303  Resp 18 08/25/22 1303  SpO2 96 % 08/25/22 1303  Vitals shown include unvalidated device data.  Last Pain:  Vitals:   08/25/22 1300  TempSrc:   PainSc: 0-No pain         Complications:  Encounter Notable Events  Notable Event Outcome Phase Comment  Difficult to intubate - expected  Intraprocedure Filed from anesthesia note documentation.

## 2022-08-25 NOTE — Anesthesia Procedure Notes (Signed)
Procedure Name: Intubation Date/Time: 08/25/2022 12:04 PM  Performed by: Renato Shin, CRNAPre-anesthesia Checklist: Patient identified, Emergency Drugs available, Suction available and Patient being monitored Patient Re-evaluated:Patient Re-evaluated prior to induction Oxygen Delivery Method: Circle system utilized Preoxygenation: Pre-oxygenation with 100% oxygen Induction Type: IV induction Ventilation: Mask ventilation without difficulty and Oral airway inserted - appropriate to patient size Laryngoscope Size: Glidescope and 4 Grade View: Grade I Tube type: Oral Tube size: 7.0 mm Number of attempts: 1 Airway Equipment and Method: Rigid stylet and Video-laryngoscopy Placement Confirmation: positive ETCO2, breath sounds checked- equal and bilateral and ETT inserted through vocal cords under direct vision Secured at: 23 cm Tube secured with: Tape Dental Injury: Teeth and Oropharynx as per pre-operative assessment  Difficulty Due To: Difficulty was anticipated, Difficult Airway- due to reduced neck mobility and Difficult Airway- due to anterior larynx Comments: Pt with prior extensive tongue resection. Tongue appears to by "tied" to the floor of mouth--limited tongue movement. Limited lower jaw movement. Pt easy to mask with OA. DL x1 Glidescope 4, glottic opening easily identified. ETT pass with ease, AOI.  +ETCO2, =BBS.

## 2022-08-26 ENCOUNTER — Encounter (HOSPITAL_COMMUNITY)
Admission: EM | Disposition: A | Discharge status: Home / Self Care | Payer: Self-pay | Source: Home / Self Care | Attending: Internal Medicine

## 2022-08-26 ENCOUNTER — Encounter (HOSPITAL_COMMUNITY): Payer: Self-pay | Admitting: Internal Medicine

## 2022-08-26 ENCOUNTER — Inpatient Hospital Stay (HOSPITAL_COMMUNITY): Payer: Medicare Other | Admitting: Certified Registered Nurse Anesthetist

## 2022-08-26 ENCOUNTER — Inpatient Hospital Stay (HOSPITAL_COMMUNITY): Payer: Medicare Other

## 2022-08-26 DIAGNOSIS — K8050 Calculus of bile duct without cholangitis or cholecystitis without obstruction: Secondary | ICD-10-CM

## 2022-08-26 DIAGNOSIS — I10 Essential (primary) hypertension: Secondary | ICD-10-CM

## 2022-08-26 DIAGNOSIS — D649 Anemia, unspecified: Secondary | ICD-10-CM

## 2022-08-26 DIAGNOSIS — E119 Type 2 diabetes mellitus without complications: Secondary | ICD-10-CM

## 2022-08-26 DIAGNOSIS — Z7984 Long term (current) use of oral hypoglycemic drugs: Secondary | ICD-10-CM

## 2022-08-26 HISTORY — PX: CHOLECYSTECTOMY: SHX55

## 2022-08-26 LAB — URINE CULTURE

## 2022-08-26 LAB — GLUCOSE, CAPILLARY
Glucose-Capillary: 115 mg/dL — ABNORMAL HIGH (ref 70–99)
Glucose-Capillary: 127 mg/dL — ABNORMAL HIGH (ref 70–99)
Glucose-Capillary: 160 mg/dL — ABNORMAL HIGH (ref 70–99)
Glucose-Capillary: 271 mg/dL — ABNORMAL HIGH (ref 70–99)

## 2022-08-26 SURGERY — LAPAROSCOPIC CHOLECYSTECTOMY WITH INTRAOPERATIVE CHOLANGIOGRAM
Anesthesia: General | Site: Abdomen

## 2022-08-26 MED ORDER — FENTANYL CITRATE PF 50 MCG/ML IJ SOSY
25.0000 ug | PREFILLED_SYRINGE | INTRAMUSCULAR | Status: DC | PRN
  Administered 2022-08-26 – 2022-08-27 (×3): 25 ug via INTRAVENOUS
  Filled 2022-08-26 (×3): qty 1

## 2022-08-26 MED ORDER — BUPIVACAINE HCL 0.25 % IJ SOLN
INTRAMUSCULAR | Status: DC | PRN
  Administered 2022-08-26: 30 mL

## 2022-08-26 MED ORDER — FENTANYL CITRATE (PF) 100 MCG/2ML IJ SOLN
INTRAMUSCULAR | Status: AC
  Filled 2022-08-26: qty 2

## 2022-08-26 MED ORDER — SODIUM CHLORIDE 0.9 % IR SOLN
Status: DC | PRN
  Administered 2022-08-26 (×2): 500 mL

## 2022-08-26 MED ORDER — PROPOFOL 10 MG/ML IV BOLUS
INTRAVENOUS | Status: AC
  Filled 2022-08-26: qty 20

## 2022-08-26 MED ORDER — ORAL CARE MOUTH RINSE: 15.0000 mL | Freq: Once | OROMUCOSAL | Status: DC

## 2022-08-26 MED ORDER — ONDANSETRON HCL 4 MG/2ML IJ SOLN: 4.0000 mg | Freq: Once | INTRAMUSCULAR | Status: DC | PRN

## 2022-08-26 MED ORDER — ROCURONIUM BROMIDE 10 MG/ML (PF) SYRINGE
PREFILLED_SYRINGE | INTRAVENOUS | Status: DC | PRN
  Administered 2022-08-26: 20 mg via INTRAVENOUS
  Administered 2022-08-26: 40 mg via INTRAVENOUS

## 2022-08-26 MED ORDER — LIDOCAINE 2% (20 MG/ML) 5 ML SYRINGE
INTRAMUSCULAR | Status: AC
  Filled 2022-08-26: qty 5

## 2022-08-26 MED ORDER — LIDOCAINE 2% (20 MG/ML) 5 ML SYRINGE
INTRAMUSCULAR | Status: DC | PRN
  Administered 2022-08-26: 60 mg via INTRAVENOUS

## 2022-08-26 MED ORDER — FENTANYL CITRATE (PF) 250 MCG/5ML IJ SOLN
INTRAMUSCULAR | Status: AC
  Filled 2022-08-26: qty 5

## 2022-08-26 MED ORDER — CHLORHEXIDINE GLUCONATE 0.12 % MT SOLN: 15.0000 mL | Freq: Once | OROMUCOSAL | Status: DC

## 2022-08-26 MED ORDER — BUPIVACAINE HCL (PF) 0.25 % IJ SOLN
INTRAMUSCULAR | Status: AC
  Filled 2022-08-26: qty 30

## 2022-08-26 MED ORDER — CHLORHEXIDINE GLUCONATE 0.12 % MT SOLN
OROMUCOSAL | Status: AC
  Administered 2022-08-26: 15 mL
  Filled 2022-08-26: qty 15

## 2022-08-26 MED ORDER — SUGAMMADEX SODIUM 200 MG/2ML IV SOLN
INTRAVENOUS | Status: DC | PRN
  Administered 2022-08-26: 200 mg via INTRAVENOUS

## 2022-08-26 MED ORDER — FENTANYL CITRATE (PF) 100 MCG/2ML IJ SOLN: 25.0000 ug | INTRAMUSCULAR | Status: DC | PRN

## 2022-08-26 MED ORDER — SODIUM CHLORIDE 0.9 % IV SOLN: INTRAVENOUS | Status: DC

## 2022-08-26 MED ORDER — DEXAMETHASONE SODIUM PHOSPHATE 10 MG/ML IJ SOLN
INTRAMUSCULAR | Status: AC
  Filled 2022-08-26: qty 1

## 2022-08-26 MED ORDER — ONDANSETRON HCL 4 MG/2ML IJ SOLN
INTRAMUSCULAR | Status: AC
  Filled 2022-08-26: qty 2

## 2022-08-26 MED ORDER — AMISULPRIDE (ANTIEMETIC) 5 MG/2ML IV SOLN
INTRAVENOUS | Status: AC
  Filled 2022-08-26: qty 4

## 2022-08-26 MED ORDER — PHENYLEPHRINE 80 MCG/ML (10ML) SYRINGE FOR IV PUSH (FOR BLOOD PRESSURE SUPPORT)
PREFILLED_SYRINGE | INTRAVENOUS | Status: DC | PRN
  Administered 2022-08-26: 160 ug via INTRAVENOUS

## 2022-08-26 MED ORDER — SODIUM CHLORIDE 0.9 % IV BOLUS
500.0000 mL | Freq: Once | INTRAVENOUS | Status: AC
  Administered 2022-08-26: 500 mL via INTRAVENOUS

## 2022-08-26 MED ORDER — PHENYLEPHRINE HCL-NACL 20-0.9 MG/250ML-% IV SOLN
INTRAVENOUS | Status: DC | PRN
  Administered 2022-08-26: 40 ug/min via INTRAVENOUS

## 2022-08-26 MED ORDER — HEMOSTATIC AGENTS (NO CHARGE) OPTIME
TOPICAL | Status: DC | PRN
  Administered 2022-08-26: 1 via TOPICAL

## 2022-08-26 MED ORDER — 0.9 % SODIUM CHLORIDE (POUR BTL) OPTIME
TOPICAL | Status: DC | PRN
  Administered 2022-08-26: 1000 mL

## 2022-08-26 MED ORDER — EPHEDRINE SULFATE-NACL 50-0.9 MG/10ML-% IV SOSY
PREFILLED_SYRINGE | INTRAVENOUS | Status: DC | PRN
  Administered 2022-08-26 (×2): 5 mg via INTRAVENOUS

## 2022-08-26 MED ORDER — SODIUM CHLORIDE 0.9 % IV SOLN
INTRAVENOUS | Status: DC | PRN
  Administered 2022-08-26: 12 mL

## 2022-08-26 MED ORDER — EPHEDRINE 5 MG/ML INJ
INTRAVENOUS | Status: AC
  Filled 2022-08-26: qty 5

## 2022-08-26 MED ORDER — PROPOFOL 10 MG/ML IV BOLUS
INTRAVENOUS | Status: DC | PRN
  Administered 2022-08-26: 80 mg via INTRAVENOUS

## 2022-08-26 MED ORDER — SODIUM CHLORIDE 0.9 % IV SOLN: INTRAVENOUS | Status: AC

## 2022-08-26 MED ORDER — FENTANYL CITRATE (PF) 250 MCG/5ML IJ SOLN
INTRAMUSCULAR | Status: DC | PRN
  Administered 2022-08-26 (×5): 50 ug via INTRAVENOUS

## 2022-08-26 MED ORDER — ALBUMIN HUMAN 5 % IV SOLN: INTRAVENOUS | Status: DC | PRN

## 2022-08-26 MED ORDER — ROCURONIUM BROMIDE 10 MG/ML (PF) SYRINGE
PREFILLED_SYRINGE | INTRAVENOUS | Status: AC
  Filled 2022-08-26: qty 10

## 2022-08-26 MED ORDER — DEXAMETHASONE SODIUM PHOSPHATE 10 MG/ML IJ SOLN
INTRAMUSCULAR | Status: DC | PRN
  Administered 2022-08-26: 5 mg via INTRAVENOUS

## 2022-08-26 MED ORDER — INSULIN ASPART 100 UNIT/ML IJ SOLN: 0.0000 [IU] | INTRAMUSCULAR | Status: DC | PRN

## 2022-08-26 MED ORDER — PHENYLEPHRINE 80 MCG/ML (10ML) SYRINGE FOR IV PUSH (FOR BLOOD PRESSURE SUPPORT)
PREFILLED_SYRINGE | INTRAVENOUS | Status: AC
  Filled 2022-08-26: qty 10

## 2022-08-26 MED ORDER — AMISULPRIDE (ANTIEMETIC) 5 MG/2ML IV SOLN
10.0000 mg | Freq: Once | INTRAVENOUS | Status: AC
  Administered 2022-08-26: 10 mg via INTRAVENOUS

## 2022-08-26 MED ORDER — FENTANYL CITRATE (PF) 100 MCG/2ML IJ SOLN
25.0000 ug | INTRAMUSCULAR | Status: DC | PRN
  Administered 2022-08-26: 50 ug via INTRAVENOUS

## 2022-08-26 MED ORDER — ONDANSETRON HCL 4 MG/2ML IJ SOLN
INTRAMUSCULAR | Status: DC | PRN
  Administered 2022-08-26: 4 mg via INTRAVENOUS

## 2022-08-26 SURGICAL SUPPLY — 47 items
ADH SKN CLS APL DERMABOND .7 (GAUZE/BANDAGES/DRESSINGS) ×1
APL PRP STRL LF DISP 70% ISPRP (MISCELLANEOUS) ×1
APPLIER CLIP ROT 10 11.4 M/L (STAPLE) ×1
APR CLP MED LRG 11.4X10 (STAPLE) ×1
BAG COUNTER SPONGE SURGICOUNT (BAG) ×2 IMPLANT
BAG SPEC RTRVL 10 TROC 200 (ENDOMECHANICALS) ×1
BAG SPNG CNTER NS LX DISP (BAG) ×1
BLADE CLIPPER SURG (BLADE) IMPLANT
CANISTER SUCT 3000ML PPV (MISCELLANEOUS) ×2 IMPLANT
CATH CHOLANG 76X19 KUMAR (CATHETERS) IMPLANT
CHLORAPREP W/TINT 26 (MISCELLANEOUS) ×2 IMPLANT
CLIP APPLIE ROT 10 11.4 M/L (STAPLE) IMPLANT
CLIP LIGATING HEMO O LOK GREEN (MISCELLANEOUS) IMPLANT
COVER MAYO STAND STRL (DRAPES) ×2 IMPLANT
COVER SURGICAL LIGHT HANDLE (MISCELLANEOUS) ×2 IMPLANT
DERMABOND ADVANCED .7 DNX12 (GAUZE/BANDAGES/DRESSINGS) ×2 IMPLANT
DRAPE C-ARM 42X120 X-RAY (DRAPES) ×2 IMPLANT
ELECT REM PT RETURN 9FT ADLT (ELECTROSURGICAL) ×1
ELECTRODE REM PT RTRN 9FT ADLT (ELECTROSURGICAL) ×2 IMPLANT
GLOVE BIOGEL PI IND STRL 7.0 (GLOVE) ×2 IMPLANT
GLOVE SURG SS PI 7.0 STRL IVOR (GLOVE) ×2 IMPLANT
GOWN STRL REUS W/ TWL LRG LVL3 (GOWN DISPOSABLE) ×6 IMPLANT
GOWN STRL REUS W/TWL LRG LVL3 (GOWN DISPOSABLE) ×3
GRASPER SUT TROCAR 14GX15 (MISCELLANEOUS) ×2 IMPLANT
HEMOSTAT SNOW SURGICEL 2X4 (HEMOSTASIS) IMPLANT
KIT BASIN OR (CUSTOM PROCEDURE TRAY) ×2 IMPLANT
KIT TURNOVER KIT B (KITS) ×2 IMPLANT
NDL 22X1.5 STRL (OR ONLY) (MISCELLANEOUS) ×2 IMPLANT
NEEDLE 22X1.5 STRL (OR ONLY) (MISCELLANEOUS) ×1 IMPLANT
NS IRRIG 1000ML POUR BTL (IV SOLUTION) ×2 IMPLANT
PAD ARMBOARD 7.5X6 YLW CONV (MISCELLANEOUS) ×2 IMPLANT
POUCH RETRIEVAL ECOSAC 10 (ENDOMECHANICALS) ×2 IMPLANT
POUCH RETRIEVAL ECOSAC 10MM (ENDOMECHANICALS) ×1
SCISSORS LAP 5X35 DISP (ENDOMECHANICALS) ×2 IMPLANT
SET CHOLANGIOGRAPH 5 50 .035 (SET/KITS/TRAYS/PACK) ×2 IMPLANT
SET IRRIG TUBING LAPAROSCOPIC (IRRIGATION / IRRIGATOR) ×2 IMPLANT
SET TUBE SMOKE EVAC HIGH FLOW (TUBING) ×2 IMPLANT
SLEEVE ENDOPATH XCEL 5M (ENDOMECHANICALS) ×4 IMPLANT
SPECIMEN JAR SMALL (MISCELLANEOUS) ×2 IMPLANT
STOPCOCK 4 WAY LG BORE MALE ST (IV SETS) IMPLANT
SUT MNCRL AB 4-0 PS2 18 (SUTURE) ×2 IMPLANT
TOWEL GREEN STERILE (TOWEL DISPOSABLE) ×2 IMPLANT
TOWEL GREEN STERILE FF (TOWEL DISPOSABLE) ×2 IMPLANT
TRAY LAPAROSCOPIC MC (CUSTOM PROCEDURE TRAY) ×2 IMPLANT
TROCAR XCEL 12X100 BLDLESS (ENDOMECHANICALS) ×2 IMPLANT
TROCAR Z-THREAD OPTICAL 5X100M (TROCAR) ×2 IMPLANT
WATER STERILE IRR 1000ML POUR (IV SOLUTION) ×2 IMPLANT

## 2022-08-26 NOTE — Progress Notes (Signed)
Date and time results received: 08/25/22 2105   Test: blood cultures Critical Value: ecoli  Name of Provider Notified: A. Zebedee Iba MD 08/25/22 2135 Orders Received? Or Actions Taken?:  No orders received. MD aware

## 2022-08-26 NOTE — Progress Notes (Signed)
Pre Procedure note for inpatients:   Kyle Rogers has been scheduled for Procedure(s): LAPAROSCOPIC CHOLECYSTECTOMY WITH POSSIBLE INTRAOPERATIVE CHOLANGIOGRAM (N/A) today. The various methods of treatment have been discussed with the patient. After consideration of the risks, benefits and treatment options the patient has consented to the planned procedure.   The patient has been seen and labs reviewed. There are no changes in the patient's condition to prevent proceeding with the planned procedure today.  Recent labs:  Lab Results  Component Value Date   WBC 8.5 08/25/2022   HGB 7.9 (L) 08/25/2022   HCT 22.6 (L) 08/25/2022   PLT 80 (L) 08/25/2022   GLUCOSE 190 (H) 08/25/2022   ALT 232 (H) 08/25/2022   AST 527 (H) 08/25/2022   NA 139 08/25/2022   K 3.9 08/25/2022   CL 104 08/25/2022   CREATININE 3.27 (H) 08/25/2022   BUN 52 (H) 08/25/2022   CO2 21 (L) 08/25/2022   INR 1.5 (H) 08/24/2022    Mickeal Skinner, MD 08/26/2022 8:10 AM

## 2022-08-26 NOTE — Consult Note (Signed)
Reason for Consult:abdominal pain Referring Provider: Bess Harvest Kyle Rogers is an 71 y.o. male.  HPI: 71 yo male with 3 months of intermittent abdominal pain. Pain became worse and was found to have choledocholithiasis with concern for cholangitis. He underwent ERCP with successful extraction and is now pain free.  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 No pertinent family history.  Social History:  reports that he has never smoked. He has never been exposed to tobacco smoke. He has never used smokeless tobacco. He reports that he does not currently use alcohol. He reports that he does not currently use drugs.  Allergies: No Known Allergies  Medications: I have reviewed the patient's current medications.  Results for orders placed or performed during the hospital encounter of 08/24/22 (from the past 48 hour(s))  Comprehensive metabolic panel     Status: Abnormal   Collection Time: 08/24/22  6:12 PM  Result Value Ref Range   Sodium 136 135 - 145 mmol/L   Potassium 3.7 3.5 - 5.1 mmol/L   Chloride 101 98 - 111 mmol/L   CO2 19 (L) 22 - 32 mmol/L   Glucose, Bld 211 (H) 70 - 99 mg/dL    Comment: Glucose reference range applies only to samples taken after fasting for at least 8 hours.   BUN 45 (H) 8 - 23 mg/dL   Creatinine, Ser 3.18 (H) 0.61 - 1.24 mg/dL   Calcium 8.0 (L) 8.9 - 10.3 mg/dL   Total Protein 7.0 6.5 - 8.1 g/dL   Albumin 3.3 (L) 3.5 - 5.0 g/dL   AST 512 (H) 15 - 41 U/L   ALT 269 (H) 0 - 44 U/L   Alkaline Phosphatase 259 (H) 38 - 126 U/L    Total Bilirubin 5.6 (H) 0.3 - 1.2 mg/dL   GFR, Estimated 20 (L) >60 mL/min    Comment: (NOTE) Calculated using the CKD-EPI Creatinine Equation (2021)    Anion gap 16 (H) 5 - 15    Comment: Performed at [facility], [address], [place], [place] [address]  CBC with Differential     Status: Abnormal   Collection Time: 08/24/22  6:12 PM  Result Value Ref Range   WBC 17.1 (H) 4.0 - 10.5 K/uL   RBC 2.47 (L) 4.22 - 5.81 MIL/uL   Hemoglobin 8.3 (L) 13.0 - 17.0 g/dL   HCT 24.3 (L) 39.0 - 52.0 %   MCV 98.4 80.0 - 100.0 fL   MCH 33.6 26.0 - 34.0 pg   MCHC 34.2 30.0 - 36.0 g/dL   RDW 14.6 11.5 - 15.5 %   Platelets 159 150 - 400 K/uL   nRBC 0.0 0.0 - 0.2 %   Neutrophils Relative % 97 %   Neutro Abs 16.5 (H) 1.7 - 7.7 K/uL   Lymphocytes Relative 1 %   Lymphs Abs 0.1 (L) 0.7 - 4.0 K/uL   Monocytes Relative 1 %   Monocytes Absolute 0.2 0.1 - 1.0 K/uL   Eosinophils Relative 0 %  Eosinophils Absolute 0.0 0.0 - 0.5 K/uL   Basophils Relative 0 %   Basophils Absolute 0.0 0.0 - 0.1 K/uL   Immature Granulocytes 1 %   Abs Immature Granulocytes 0.23 (H) 0.00 - 0.07 K/uL    Comment: Performed at [facility], [address] [place], [place] [address]  Lipase, blood     Status: None   Collection Time: 08/24/22  6:12 PM  Result Value Ref Range   Lipase 32 11 - 51 U/L    Comment: Performed at [facility], [address], [place], [place] [address]  Hepatitis panel, acute     Status: None   Collection Time: 08/24/22  6:12 PM  Result Value Ref Range   Hepatitis B Surface Ag NON REACTIVE NON REACTIVE   HCV Ab NON REACTIVE NON REACTIVE    Comment: (NOTE) Nonreactive HCV antibody screen is consistent with no HCV infections,  unless recent infection is suspected or other evidence exists to indicate HCV infection.     Hep A IgM NON REACTIVE NON REACTIVE   Hep B C IgM NON REACTIVE NON REACTIVE    Comment: Performed at [facility], [place] [address] [place], [place] [address]  Lactic acid,  plasma     Status: Abnormal   Collection Time: 08/24/22  6:12 PM  Result Value Ref Range   Lactic Acid, Venous 3.8 (HH) 0.5 - 1.9 mmol/L    Comment: CRITICAL RESULT CALLED TO, READ BACK BY AND VERIFIED WITH: MILLS M ON 08/24/22 AT 1855 BY LOY,C Performed at [facility], [address], [place], [place] [address]   Protime-INR     Status: Abnormal   Collection Time: 08/24/22  8:00 PM  Result Value Ref Range   Prothrombin Time 18.2 (H) 11.4 - 15.2 seconds   INR 1.5 (H) 0.8 - 1.2    Comment: (NOTE) INR goal varies based on device and disease states. Performed at [facility], [address], [place], [place] [address]   APTT     Status: Abnormal   Collection Time: 08/24/22  8:00 PM  Result Value Ref Range   aPTT 42 (H) 24 - 36 seconds    Comment:        IF BASELINE aPTT IS ELEVATED, SUGGEST PATIENT RISK ASSESSMENT BE USED TO DETERMINE APPROPRIATE ANTICOAGULANT THERAPY. Performed at [facility], [address], [place], [place] [address]   Blood Culture (routine x 2)     Status: None (Preliminary result)   Collection Time: 08/24/22  8:00 PM   Specimen: Left Antecubital; Blood  Result Value Ref Range   Specimen Description      LEFT ANTECUBITAL BLOOD Performed at [place] [address] [place], [place] [address]    Special Requests      Blood Culture adequate volume Performed at [facility], [address] [place], [place] [address]    Culture  Setup Time      GRAM NEGATIVE RODS IN BOTH AEROBIC AND ANAEROBIC BOTTLES Gram Stain Report Called to,Read Back By and Verified With: HEMZE @ 1027 ON [id number] BY HENDERSON L RESULTS PREVIOULSLY CALLED. CRITICAL RESULT CALLED TO, READ BACK BY AND VERIFIED WITH:  Lars Masson, RN 08/25/22 2105 A. LAFRANCE Performed at [facility], [place] [address] [place],  [address]    Culture GRAM NEGATIVE RODS    Report Status PENDING   Blood Culture ID Panel (Reflexed)     Status: Abnormal   Collection Time: 08/24/22  8:00 PM  Result Value Ref Range   Enterococcus faecalis NOT DETECTED NOT DETECTED   Enterococcus Faecium NOT DETECTED NOT DETECTED   Listeria monocytogenes NOT DETECTED NOT DETECTED   Staphylococcus species NOT DETECTED NOT DETECTED   Staphylococcus aureus (BCID) NOT DETECTED NOT DETECTED   Staphylococcus epidermidis NOT DETECTED NOT DETECTED   Staphylococcus lugdunensis NOT DETECTED NOT DETECTED   Streptococcus species NOT DETECTED NOT DETECTED   Streptococcus agalactiae NOT DETECTED NOT DETECTED   Streptococcus pneumoniae NOT DETECTED NOT DETECTED   Streptococcus pyogenes NOT DETECTED NOT DETECTED   A.calcoaceticus-baumannii NOT DETECTED NOT DETECTED   Bacteroides fragilis NOT DETECTED NOT DETECTED   Enterobacterales DETECTED (A) NOT DETECTED    Comment: Enterobacterales represent a large order of gram negative bacteria, not a single organism. CRITICAL RESULT CALLED TO, READ BACK BY AND VERIFIED WITH:  Lars Masson, RN 08/25/22 2105 A. LAFRANCE    Enterobacter cloacae complex NOT DETECTED NOT DETECTED   Escherichia coli DETECTED (A) NOT DETECTED    Comment: CRITICAL RESULT CALLED TO, READ BACK BY AND VERIFIED WITH:  Lars Masson, RN 08/25/22 2105 A. LAFRANCE    Klebsiella aerogenes NOT DETECTED NOT DETECTED   Klebsiella oxytoca NOT DETECTED NOT DETECTED   Klebsiella pneumoniae NOT DETECTED NOT DETECTED   Proteus species NOT DETECTED NOT DETECTED   Salmonella species NOT DETECTED NOT DETECTED   Serratia marcescens NOT DETECTED NOT DETECTED   Haemophilus influenzae NOT DETECTED NOT DETECTED   Neisseria meningitidis NOT DETECTED NOT DETECTED   Pseudomonas aeruginosa NOT DETECTED NOT DETECTED   Stenotrophomonas maltophilia NOT DETECTED NOT DETECTED   Candida albicans NOT DETECTED NOT DETECTED   Candida auris NOT DETECTED NOT DETECTED   Candida glabrata NOT DETECTED NOT DETECTED   Candida krusei NOT DETECTED NOT DETECTED   Candida parapsilosis NOT DETECTED NOT DETECTED    Candida tropicalis NOT DETECTED NOT DETECTED   Cryptococcus neoformans/gattii NOT DETECTED NOT DETECTED   CTX-M ESBL NOT DETECTED NOT DETECTED   Carbapenem resistance IMP NOT DETECTED NOT DETECTED   Carbapenem resistance KPC NOT DETECTED NOT DETECTED   Carbapenem resistance NDM NOT DETECTED NOT DETECTED   Carbapenem resist OXA 48 LIKE NOT DETECTED NOT DETECTED   Carbapenem resistance VIM NOT DETECTED NOT DETECTED    Comment: Performed at [facility], [place] [address], [place], [place] [address]  Lactic acid, plasma     Status: Abnormal   Collection Time: 08/24/22  8:14 PM  Result Value Ref Range   Lactic Acid, Venous 3.0 (HH) 0.5 - 1.9 mmol/L    Comment: CRITICAL VALUE NOTED.  VALUE IS CONSISTENT WITH PREVIOUSLY REPORTED AND CALLED VALUE. Performed at [facility], [address] [place], [place] [address]   Blood Culture (routine x 2)     Status: None (Preliminary result)   Collection Time: 08/24/22  8:14 PM   Specimen: BLOOD LEFT HAND  Result Value Ref Range   Specimen Description      BLOOD LEFT HAND BOTTLES DRAWN AEROBIC AND ANAEROBIC   Special Requests Blood Culture adequate volume    Culture  Setup Time      GRAM NEGATIVE RODS ANAEROBIC BOTTLE ONLY Gram Stain Report Called to,Read Back By and Verified With: HEMZE @ [id number] BY HENDERSON L    Culture      NO GROWTH 2 DAYS Performed at [facility], [address] [place], [place] [address]    Report Status PENDING   Resp Panel by RT-PCR (Flu A&B, Covid)  Anterior Nasal Swab     Status: None   Collection Time: 08/24/22  8:53 PM   Specimen: Anterior Nasal Swab  Result Value Ref Range   SARS Coronavirus 2 by RT PCR NEGATIVE NEGATIVE    Comment: (NOTE) SARS-CoV-2 target nucleic acids are NOT DETECTED.  The SARS-CoV-2 RNA is generally detectable in upper respiratory specimens during the acute phase of infection. The lowest concentration of SARS-CoV-2 viral copies this assay can detect is 138 copies/mL. A negative  result does not preclude SARS-Cov-2 infection and should not be used as the sole basis for treatment or other patient management decisions. A negative result may occur with  improper specimen collection/handling, submission of specimen other than nasopharyngeal swab, presence of viral mutation(s) within the areas targeted by this assay, and inadequate number of viral copies(<138 copies/mL). A negative result must be combined with clinical observations, patient history, and epidemiological information. The expected result is Negative.  Fact Sheet for Patients:  EntrepreneurPulse.com.au  Fact Sheet for Healthcare Providers:  IncredibleEmployment.be  This test is no t yet approved or cleared by the [place] FDA and  has been authorized for detection and/or diagnosis of SARS-CoV-2 by FDA under an Emergency Use Authorization (EUA). This EUA will remain  in effect (meaning this test can be used) for the duration of the COVID-19 declaration under Section 564(b)(1) of the Act, 21 U.S.C.section 360bbb-3(b)(1), unless the authorization is terminated  or revoked sooner.       Influenza A by PCR NEGATIVE NEGATIVE   Influenza B by PCR NEGATIVE NEGATIVE    Comment: (NOTE) The Xpert Xpress SARS-CoV-2/FLU/RSV plus assay is intended as an aid in the diagnosis of influenza from Nasopharyngeal swab specimens and should not be used as a sole basis for treatment. Nasal washings and aspirates are unacceptable for Xpert Xpress SARS-CoV-2/FLU/RSV testing.  Fact Sheet for Patients: EntrepreneurPulse.com.au  Fact Sheet for Healthcare Providers: IncredibleEmployment.be  This test is not yet approved or cleared by the [place] FDA and has been authorized for detection and/or diagnosis of SARS-CoV-2 by FDA under an Emergency Use Authorization (EUA). This EUA will remain in effect (meaning this test can be used) for the  duration of the COVID-19 declaration under Section 564(b)(1) of the Act, 21 U.S.C. section 360bbb-3(b)(1), unless the authorization is terminated or revoked.  Performed at [facility], [address], [place], [place] [address]   Urinalysis, Routine w reflex microscopic Urine, Clean Catch     Status: Abnormal   Collection Time: 08/24/22  8:53 PM  Result Value Ref Range   Color, Urine AMBER (A) YELLOW    Comment: BIOCHEMICALS MAY BE AFFECTED BY COLOR   APPearance HAZY (A) CLEAR   Specific Gravity, Urine 1.022 1.005 - 1.030   pH 5.0 5.0 - 8.0   Glucose, UA >=500 (A) NEGATIVE mg/dL   Hgb urine dipstick LARGE (A) NEGATIVE   Bilirubin Urine NEGATIVE NEGATIVE   Ketones, ur 5 (A) NEGATIVE mg/dL   Protein, ur >=300 (A) NEGATIVE mg/dL   Nitrite NEGATIVE NEGATIVE   Leukocytes,Ua NEGATIVE NEGATIVE   RBC / HPF 0-5 0 - 5 RBC/hpf   WBC, UA 0-5 0 - 5 WBC/hpf   Bacteria, UA NONE SEEN NONE SEEN   Squamous Epithelial / LPF 0-5 0 - 5    Comment: Performed at [facility], [address] [place], [place] [address]  Lactic acid, plasma     Status: None   Collection Time: 08/25/22  2:08 AM  Result Value Ref Range  Lactic Acid, Venous 1.6 0.5 - 1.9 mmol/L    Comment: Performed at [facility], [place] [address], [place], [place] [address]  Magnesium     Status: Abnormal   Collection Time: 08/25/22  2:08 AM  Result Value Ref Range   Magnesium 1.3 (L) 1.7 - 2.4 mg/dL    Comment: Performed at [facility] [address] [place], [place] [address]  Phosphorus     Status: Abnormal   Collection Time: 08/25/22  2:08 AM  Result Value Ref Range   Phosphorus 5.7 (H) 2.5 - 4.6 mg/dL    Comment: Performed at [facility] [address] [place], [place] [address]  Comprehensive metabolic panel     Status: Abnormal   Collection Time: 08/25/22  2:08 AM  Result Value Ref Range   Sodium 139 135 - 145 mmol/L   Potassium 3.9 3.5 - 5.1 mmol/L   Chloride 104 98 - 111 mmol/L   CO2 21 (L) 22 -  32 mmol/L   Glucose, Bld 190 (H) 70 - 99 mg/dL    Comment: Glucose reference range applies only to samples taken after fasting for at least 8 hours.   BUN 52 (H) 8 - 23 mg/dL   Creatinine, Ser 3.27 (H) 0.61 - 1.24 mg/dL   Calcium 7.9 (L) 8.9 - 10.3 mg/dL   Total Protein 5.6 (L) 6.5 - 8.1 g/dL   Albumin 2.5 (L) 3.5 - 5.0 g/dL   AST 527 (H) 15 - 41 U/L   ALT 232 (H) 0 - 44 U/L   Alkaline Phosphatase 197 (H) 38 - 126 U/L   Total Bilirubin 4.4 (H) 0.3 - 1.2 mg/dL   GFR, Estimated 19 (L) >60 mL/min    Comment: (NOTE) Calculated using the CKD-EPI Creatinine Equation (2021)    Anion gap 14 5 - 15    Comment: Performed at [facility], [place] [address] [place], [place] [address]  CBC     Status: Abnormal   Collection Time: 08/25/22  2:08 AM  Result Value Ref Range   WBC 11.1 (H) 4.0 - 10.5 K/uL   RBC 2.02 (L) 4.22 - 5.81 MIL/uL   Hemoglobin 7.0 (L) 13.0 - 17.0 g/dL   HCT 19.3 (L) 39.0 - 52.0 %   MCV 95.5 80.0 - 100.0 fL   MCH 34.7 (H) 26.0 - 34.0 pg   MCHC 36.3 (H) 30.0 - 36.0 g/dL   RDW 14.6 11.5 - 15.5 %   Platelets 103 (L) 150 - 400 K/uL   nRBC 0.0 0.0 - 0.2 %    Comment: Performed at [facility], [place] [address] [place], [place] [address]  Type and screen [place]     Status: None (Preliminary result)   Collection Time: 08/25/22  8:37 AM  Result Value Ref Range   ABO/RH(D) B POS    Antibody Screen NEG    Sample Expiration 08/28/2022,2359    Unit Number [id number]    Blood Component Type RED CELLS,LR    Unit division 00    Status of Unit ISSUED    Transfusion Status OK TO TRANSFUSE    Crossmatch Result      Compatible Performed at [facility], [place] [address], [place], [place] [address]   Prepare RBC (crossmatch)     Status: None   Collection Time: 08/25/22  8:40 AM  Result Value Ref Range   Order Confirmation      ORDER  PROCESSED BY BLOOD BANK Performed at [facility], [place] [address] [place], [place] [address]    Glucose, capillary     Status: Abnormal   Collection Time: 08/25/22  8:44 AM  Result Value Ref Range   Glucose-Capillary 136 (H) 70 - 99 mg/dL    Comment: Glucose reference range applies only to samples taken after fasting for at least 8 hours.  Glucose, capillary     Status: Abnormal   Collection Time: 08/25/22 12:53 PM  Result Value Ref Range   Glucose-Capillary 163 (H) 70 - 99 mg/dL    Comment: Glucose reference range applies only to samples taken after fasting for at least 8 hours.  Glucose, capillary     Status: Abnormal   Collection Time: 08/25/22  3:58 PM  Result Value Ref Range   Glucose-Capillary 151 (H) 70 - 99 mg/dL    Comment: Glucose reference range applies only to samples taken after fasting for at least 8 hours.  CBC     Status: Abnormal   Collection Time: 08/25/22  6:31 PM  Result Value Ref Range   WBC 8.5 4.0 - 10.5 K/uL   RBC 2.38 (L) 4.22 - 5.81 MIL/uL   Hemoglobin 7.9 (L) 13.0 - 17.0 g/dL   HCT 22.6 (L) 39.0 - 52.0 %   MCV 95.0 80.0 - 100.0 fL   MCH 33.2 26.0 - 34.0 pg   MCHC 35.0 30.0 - 36.0 g/dL   RDW 15.4 11.5 - 15.5 %   Platelets 80 (L) 150 - 400 K/uL    Comment: Immature Platelet Fraction may be clinically indicated, consider ordering this additional test [id number] REPEATED TO VERIFY PLATELET COUNT CONFIRMED BY SMEAR    nRBC 0.0 0.0 - 0.2 %    Comment: Performed at [facility], [place] [address] [place], [place] [address]  Glucose, capillary     Status: Abnormal   Collection Time: 08/25/22  8:47 PM  Result Value Ref Range   Glucose-Capillary 129 (H) 70 - 99 mg/dL    Comment: Glucose reference range applies only to samples taken after fasting for at least 8 hours.  Glucose, capillary     Status: Abnormal   Collection Time: 08/26/22  7:51 AM  Result Value Ref Range   Glucose-Capillary 115 (H) 70 - 99 mg/dL    Comment: Glucose reference range applies only to samples taken after fasting for at least 8 hours.    DG ERCP  Result Date:  08/25/2022 CLINICAL DATA:  71 year old male with history of distal common bile duct obstruction. EXAM: ERCP TECHNIQUE: Multiple spot images obtained with the fluoroscopic device and submitted for interpretation post-procedure. FLUOROSCOPY TIME:  338.76 mGy COMPARISON:  08/24/2022, 08/25/2022 FINDINGS: Endoscope is positioned in the second portion the duodenum. There is retrograde cannulation of the common bile duct. Retrograde cholangiogram demonstrates moderate extrahepatic biliary ductal dilation with rounded filling defect near the ampulla. Balloon sweep and sphincterotomy were performed with relative similar appearance of the common bile duct on final image. IMPRESSION: Distal common bile duct obstruction with intervention. These images were submitted for radiologic interpretation only. Please see the procedural report for the full procedural details, amount of contrast, and the fluoroscopy time utilized. Ruthann Cancer, MD [facility] Radiology Electronically Signed   By: Ruthann Cancer M.D.   On: 08/25/2022 13:51   US Abdomen Limited  Result Date: 08/25/2022 CLINICAL DATA:  71 year old male with history of transaminitis and sepsis. EXAM: ULTRASOUND ABDOMEN LIMITED RIGHT UPPER  QUADRANT COMPARISON:  CT abdomen pelvis from 08/24/2022 FINDINGS: Gallbladder: Diffuse thickening of gallbladder wall measuring up to 0.8 cm. No significant pericholecystic fluid. No significant gallbladder distension. Layering sludge in the infundibulum. No evidence of definite cholelithiasis. No sonographic Murphy sign. Common bile duct: Diameter: 1.1 cm. Liver: No focal lesion identified. Within normal limits in parenchymal echogenicity and echotexture. Portal vein is patent on color Doppler imaging with normal direction of blood flow towards the liver. Other: No perihepatic ascites. IMPRESSION: 1. Gallbladder wall thickening and layering sludge without evidence of significant  distension or cholelithiasis. These findings are unlikely to represent acute cholecystitis. 2. Common bile duct dilation without evidence of intrahepatic biliary ductal dilation, concerning for distal common bile duct obstructive process. 3. Normal sonographic appearance of the hepatic parenchyma. Ruthann Cancer, MD [facility] Radiology Electronically Signed   By: Ruthann Cancer M.D.   On: 08/25/2022 09:25   CT ABDOMEN PELVIS WO CONTRAST  Addendum Date: 08/25/2022   ADDENDUM REPORT: 08/25/2022 07:33 ADDENDUM: Upon further review coronal image 46 of series 5 and axial image 38 of series 2 demonstrates a calcification at the expected level of the ampulla measuring 7 mm. This is associated with dilatation of the common bile duct estimated to measure up to 13 mm in the porta hepatis on coronal image 41 of series 5. These results were discussed by telephone at the time of interpretation on 08/25/2022 at 7:25 am with provider Dr. [place] Norway of GI, who verbally acknowledged these results. Electronically Signed   By: Vinnie Langton M.D.   On: 08/25/2022 07:33   Result Date: 08/25/2022 CLINICAL DATA:  Epigastric pain EXAM: CT ABDOMEN AND PELVIS WITHOUT CONTRAST TECHNIQUE: Multidetector CT imaging of the abdomen and pelvis was performed following the standard protocol without IV contrast. RADIATION DOSE REDUCTION: This exam was performed according to the departmental dose-optimization program which includes automated exposure control, adjustment of the mA and/or kV according to patient size and/or use of iterative reconstruction technique. COMPARISON:  None Available. FINDINGS: Lower chest: Scarring in the lung bases. No acute findings. Calcifications in the visualized coronary arteries. Hepatobiliary: Gallbladder wall is indistinct and thickened. Probable stones within the gallbladder. Appearance is concerning for acute cholecystitis. No focal hepatic abnormality. Pancreas: No  focal abnormality or ductal dilatation. Spleen: No focal abnormality.  Normal size. Adrenals/Urinary Tract: No adrenal abnormality. No focal renal abnormality. No stones or hydronephrosis. Urinary bladder is unremarkable. Stomach/Bowel: Stomach, large and small bowel grossly unremarkable. Normal appendix. Vascular/Lymphatic: Aortic atherosclerosis. No evidence of aneurysm or adenopathy. Reproductive: Radiation seeds in the region of the prostate. No visible focal abnormality. Other: No free fluid or free air. Musculoskeletal: No acute bony abnormality. IMPRESSION: Gallbladder wall appears thickened and indistinct. Suspect gallstones within the gallbladder. Findings are concerning for acute cholecystitis. This could be further evaluated with right upper quadrant ultrasound if clinically indicated. Coronary artery disease, aortic atherosclerosis. Electronically Signed: By: Rolm Baptise M.D. On: 08/24/2022 19:39   DG Chest Port 1 View  Result Date: 08/24/2022 CLINICAL DATA:  Sepsis, abnormal liver function tests EXAM: PORTABLE CHEST 1 VIEW COMPARISON:  None Available. FINDINGS: Single frontal view of the chest demonstrates an unremarkable cardiac silhouette. No airspace disease, effusion, or pneumothorax. No acute bony abnormalities. IMPRESSION: 1. No acute intrathoracic process. Electronically Signed   By: Randa Ngo M.D.   On: 08/24/2022 19:41    ROS  PE Blood pressure (!) 85/55, pulse 65, temperature 97.8 F (36.6 C), temperature source Axillary, resp. rate  17, height '5\' 6"'$  (1.676 m), weight 67.6 kg, SpO2 97 %. Constitutional: NAD; conversant; no deformities Eyes: Moist conjunctiva; no lid lag; anicteric; PERRL Neck: Trachea midline; no thyromegaly Lungs: Normal respiratory effort; no tactile fremitus CV: RRR; no palpable thrills; no pitting edema GI: Abd soft, NT; no palpable hepatosplenomegaly MSK: Normal gait; no clubbing/cyanosis Psychiatric: Appropriate affect; alert and oriented  x3 Lymphatic: No palpable cervical or axillary lymphadenopathy Skin: No major subcutaneous nodules. Warm and dry   Assessment/Plan: 71 yo male with choledocholithiasis s/p ERCP. We discussed indications for gallbladder removal to reduce future risk of biliary disease. We discussed the etiology of her pain, we discussed treatment options and recommended surgery. We discussed details of surgery including general anesthesia, laparoscopic approach, identification of cystic duct and common bile duct. Ligation of cystic duct and cystic artery. Possible need for intraoperative cholangiogram or open procedure. Possible risks of common bile duct injury, liver injury, cystic duct leak, bleeding, infection, post-cholecystectomy syndrome. The patient showed good understanding and all questions were answered   I reviewed last 24 h vitals and pain scores, last 48 h intake and output, last 24 h labs and trends, and last 24 h imaging results.  This care required high  level of medical decision making.   Arta Bruce Vung Kush 08/26/2022, 8:08 AM

## 2022-08-26 NOTE — Progress Notes (Signed)
  Transition of Care [facility]) Screening Note   Patient Details  Name: Kyle Rogers Date of Birth: 01/01/1951   Transition of Care [facility]) CM/SW Contact:    Bartholomew Crews, RN Phone Number: [phone] 08/26/2022, 1:34 PM    Transition of Care Department [facility]) has reviewed patient and no TOC needs have been identified at this time. We will continue to monitor patient advancement through interdisciplinary progression rounds. If new patient transition needs arise, please place a TOC consult.

## 2022-08-26 NOTE — Progress Notes (Signed)
[facility] PROGRESS NOTE    Progress Note  LENDELL GALLICK  [id number] DOB: 1950-10-04 DOA: 08/24/2022 PCP: Wenda Low, MD     Brief Narrative:   KARTER HELLMER is an 71 y.o. male past medical history significant for type 2 diabetes mellitus, essential hypertension comes in for 4 days of abdominal pain worse after eating went to see his PCP LFTs were significantly elevated, was also having fevers sent to the ED, CT scan showed gallbladder wall thickening, gallbladder is and concern for acute cholecystitis was started on IV fluids IV antibiotics GI was consulted At [facility] who recommended the patient be transferred to [facility] for possible ERCP, Carol Ada to see.   Assessment/Plan:   Severe sepsis secondary to Acute cholangitis: Started empirically on IV fluids IV antibiotics and fluid resuscitated. Blood cultures grew E. coli. Has remained afebrile currently on IV Rocephin. Surgery was consulted he status post lap chole. Fentanyl for pain, keep n.p.o. surgery to dictate when to start diet.  Normocytic anemia: He denies any melanotic stools or bright red blood per rectum. He will see feels tired. Status post 1 unit of packed red blood cells check a CBC tomorrow morning.  Elevated LFT's: Likely due to above.  Diabetes mellitus type 2 uncontrolled with hyperglycemia: Currently n.p.o. with sliding scale insulin, blood glucose relatively controlled.  Essential hypertension: Blood pressure soft hold all antihypertensive medication.  Hyperlipidemia: Noted.  Chronic kidney disease stage IV Creatinine appears to be at baseline.    DVT prophylaxis: lovenox Family Communication:none Status is: Inpatient Remains inpatient appropriate because: Acute cholecystitis    Code Status:     Code Status Orders  (From admission, onward)           Start     Ordered   09/02/2022 0028  Full code  Continuous        09-02-2022 0027           Code Status  History     This patient has a current code status but no historical code status.         IV Access:   Peripheral IV   Procedures and diagnostic studies:   DG ERCP  Result Date: 02-Sep-2022 CLINICAL DATA:  71 year old male with history of distal common bile duct obstruction. EXAM: ERCP TECHNIQUE: Multiple spot images obtained with the fluoroscopic device and submitted for interpretation post-procedure. FLUOROSCOPY TIME:  338.76 mGy COMPARISON:  08/24/2022, 09/02/2022 FINDINGS: Endoscope is positioned in the second portion the duodenum. There is retrograde cannulation of the common bile duct. Retrograde cholangiogram demonstrates moderate extrahepatic biliary ductal dilation with rounded filling defect near the ampulla. Balloon sweep and sphincterotomy were performed with relative similar appearance of the common bile duct on final image. IMPRESSION: Distal common bile duct obstruction with intervention. These images were submitted for radiologic interpretation only. Please see the procedural report for the full procedural details, amount of contrast, and the fluoroscopy time utilized. Ruthann Cancer, MD [facility] Electronically Signed   By: Ruthann Cancer M.D.   On: 02-Sep-2022 13:51   US Abdomen Limited  Result Date: 2022-09-02 CLINICAL DATA:  71 year old male with history of transaminitis and sepsis. EXAM: ULTRASOUND ABDOMEN LIMITED RIGHT UPPER QUADRANT COMPARISON:  CT abdomen pelvis from 08/24/2022 FINDINGS: Gallbladder: Diffuse thickening of gallbladder wall measuring up to 0.8 cm. No significant pericholecystic fluid. No significant gallbladder distension. Layering sludge in the infundibulum. No evidence of definite cholelithiasis. No sonographic Murphy sign. Common bile duct: Diameter:  1.1 cm. Liver: No focal lesion identified. Within normal limits in parenchymal echogenicity and echotexture. Portal vein is patent on color Doppler  imaging with normal direction of blood flow towards the liver. Other: No perihepatic ascites. IMPRESSION: 1. Gallbladder wall thickening and layering sludge without evidence of significant distension or cholelithiasis. These findings are unlikely to represent acute cholecystitis. 2. Common bile duct dilation without evidence of intrahepatic biliary ductal dilation, concerning for distal common bile duct obstructive process. 3. Normal sonographic appearance of the hepatic parenchyma. Ruthann Cancer, MD [facility] Electronically Signed   By: Ruthann Cancer M.D.   On: 08/25/2022 09:25   CT ABDOMEN PELVIS WO CONTRAST  Addendum Date: 08/25/2022   ADDENDUM REPORT: 08/25/2022 07:33 ADDENDUM: Upon further review coronal image 46 of series 5 and axial image 38 of series 2 demonstrates a calcification at the expected level of the ampulla measuring 7 mm. This is associated with dilatation of the common bile duct estimated to measure up to 13 mm in the porta hepatis on coronal image 41 of series 5. These results were discussed by telephone at the time of interpretation on 08/25/2022 at 7:25 am with provider Dr. Benson Norway of GI, who verbally acknowledged these results. Electronically Signed   By: Vinnie Langton M.D.   On: 08/25/2022 07:33   Result Date: 08/25/2022 CLINICAL DATA:  Epigastric pain EXAM: CT ABDOMEN AND PELVIS WITHOUT CONTRAST TECHNIQUE: Multidetector CT imaging of the abdomen and pelvis was performed following the standard protocol without IV contrast. RADIATION DOSE REDUCTION: This exam was performed according to the departmental dose-optimization program which includes automated exposure control, adjustment of the mA and/or kV according to patient size and/or use of iterative reconstruction technique. COMPARISON:  None Available. FINDINGS: Lower chest: Scarring in the lung bases. No acute findings. Calcifications in the visualized coronary arteries.  Hepatobiliary: Gallbladder wall is indistinct and thickened. Probable stones within the gallbladder. Appearance is concerning for acute cholecystitis. No focal hepatic abnormality. Pancreas: No focal abnormality or ductal dilatation. Spleen: No focal abnormality.  Normal size. Adrenals/Urinary Tract: No adrenal abnormality. No focal renal abnormality. No stones or hydronephrosis. Urinary bladder is unremarkable. Stomach/Bowel: Stomach, large and small bowel grossly unremarkable. Normal appendix. Vascular/Lymphatic: Aortic atherosclerosis. No evidence of aneurysm or adenopathy. Reproductive: Radiation seeds in the region of the prostate. No visible focal abnormality. Other: No free fluid or free air. Musculoskeletal: No acute bony abnormality. IMPRESSION: Gallbladder wall appears thickened and indistinct. Suspect gallstones within the gallbladder. Findings are concerning for acute cholecystitis. This could be further evaluated with right upper quadrant ultrasound if clinically indicated. Coronary artery disease, aortic atherosclerosis. Electronically Signed: By: Rolm Baptise M.D. On: 08/24/2022 19:39   DG Chest Port 1 View  Result Date: 08/24/2022 CLINICAL DATA:  Sepsis, abnormal liver function tests EXAM: PORTABLE CHEST 1 VIEW COMPARISON:  None Available. FINDINGS: Single frontal view of the chest demonstrates an unremarkable cardiac silhouette. No airspace disease, effusion, or pneumothorax. No acute bony abnormalities. IMPRESSION: 1. No acute intrathoracic process. Electronically Signed   By: Randa Ngo M.D.   On: 08/24/2022 19:41     Medical Consultants:   None.   Subjective:    Felicie Morn complaining of abdominal pain.  Objective:    Vitals:   08/26/22 0600 08/26/22 0700 08/26/22 0749 08/26/22 0816  BP: (!) 85/57 (!) 94/58 (!) 85/55 102/61  Pulse:   65   Resp:   17 16  Temp:   97.8 F (36.6 C) 98  F (36.7 C)  TempSrc:   Axillary Oral  SpO2:   97% 94%  Weight:       Height:       SpO2: 94 % O2 Flow Rate (L/min): 2 L/min   Intake/Output Summary (Last 24 hours) at 08/26/2022 1004 Last data filed at 08/26/2022 0951 Gross per 24 hour  Intake 1440.86 ml  Output 375 ml  Net 1065.86 ml   Filed Weights   08/24/22 1748  Weight: 67.6 kg    Exam: General exam: In no acute distress. Respiratory system: Good air movement and clear to auscultation. Cardiovascular system: S1 & S2 heard, RRR. No JVD. Extremities: No pedal edema. Skin: No rashes, lesions or ulcers Psychiatry: Judgement and insight appear normal. Mood & affect appropriate.  Data Reviewed:    Labs: Basic Metabolic Panel: Recent Labs  Lab 08/24/22 1812 08/25/22 0208  NA 136 139  K 3.7 3.9  CL 101 104  CO2 19* 21*  GLUCOSE 211* 190*  BUN 45* 52*  CREATININE 3.18* 3.27*  CALCIUM 8.0* 7.9*  MG  --  1.3*  PHOS  --  5.7*    GFR Estimated Creatinine Clearance: 18.7 mL/min (A) (by C-G formula based on SCr of 3.27 mg/dL (H)). Liver Function Tests: Recent Labs  Lab 08/24/22 1812 08/25/22 0208  AST 512* 527*  ALT 269* 232*  ALKPHOS 259* 197*  BILITOT 5.6* 4.4*  PROT 7.0 5.6*  ALBUMIN 3.3* 2.5*    Recent Labs  Lab 08/24/22 1812  LIPASE 32    No results for input(s): "AMMONIA" in the last 168 hours. Coagulation profile Recent Labs  Lab 08/24/22 2000  INR 1.5*    COVID-19 Labs  No results for input(s): "DDIMER", "FERRITIN", "LDH", "CRP" in the last 72 hours.  Lab Results  Component Value Date   SARSCOV2NAA NEGATIVE 08/24/2022    CBC: Recent Labs  Lab 08/24/22 1812 08/25/22 0208 08/25/22 1831  WBC 17.1* 11.1* 8.5  NEUTROABS 16.5*  --   --   HGB 8.3* 7.0* 7.9*  HCT 24.3* 19.3* 22.6*  MCV 98.4 95.5 95.0  PLT 159 103* 80*    Cardiac Enzymes: No results for input(s): "CKTOTAL", "CKMB", "CKMBINDEX", "TROPONINI" in the last 168 hours. BNP (last 3 results) No results for input(s): "PROBNP" in the last 8760 hours. CBG: Recent Labs  Lab  08/25/22 0844 08/25/22 1253 08/25/22 1558 08/25/22 2047 08/26/22 0751  GLUCAP 136* 163* 151* 129* 115*   D-Dimer: No results for input(s): "DDIMER" in the last 72 hours. Hgb A1c: No results for input(s): "HGBA1C" in the last 72 hours. Lipid Profile: No results for input(s): "CHOL", "HDL", "LDLCALC", "TRIG", "CHOLHDL", "LDLDIRECT" in the last 72 hours. Thyroid function studies: No results for input(s): "TSH", "T4TOTAL", "T3FREE", "THYROIDAB" in the last 72 hours.  Invalid input(s): "FREET3" Anemia work up: No results for input(s): "VITAMINB12", "FOLATE", "FERRITIN", "TIBC", "IRON", "RETICCTPCT" in the last 72 hours. Sepsis Labs: Recent Labs  Lab 08/24/22 1812 08/24/22 2014 08/25/22 0208 08/25/22 1831  WBC 17.1*  --  11.1* 8.5  LATICACIDVEN 3.8* 3.0* 1.6  --     Microbiology Recent Results (from the past 240 hour(s))  Blood Culture (routine x 2)     Status: Abnormal (Preliminary result)   Collection Time: 08/24/22  8:00 PM   Specimen: Left Antecubital; Blood  Result Value Ref Range Status   Specimen Description   Final    LEFT ANTECUBITAL BLOOD Performed at [facility] [address] [place], [place] [address]  Special Requests   Final    Blood Culture adequate volume Performed at [facility], [address] [place], [place] [address]    Culture  Setup Time   Final    GRAM NEGATIVE RODS IN BOTH AEROBIC AND ANAEROBIC BOTTLES Gram Stain Report Called to,Read Back By and Verified With: HEMZE @ [id number] ON [id number] BY HENDERSON L RESULTS PREVIOULSLY CALLED. CRITICAL RESULT CALLED TO, READ BACK BY AND VERIFIED WITH:  Lars Masson, RN 08/25/22 2105 A. LAFRANCE    Culture (A)  Final    ESCHERICHIA COLI CULTURE REINCUBATED FOR BETTER GROWTH Performed at [facility], [place] [address], [place], [place] [address]    Report Status PENDING  Incomplete  Blood Culture ID Panel (Reflexed)     Status: Abnormal   Collection Time: 08/24/22  8:00 PM  Result Value  Ref Range Status   Enterococcus faecalis NOT DETECTED NOT DETECTED Final   Enterococcus Faecium NOT DETECTED NOT DETECTED Final   Listeria monocytogenes NOT DETECTED NOT DETECTED Final   Staphylococcus species NOT DETECTED NOT DETECTED Final   Staphylococcus aureus (BCID) NOT DETECTED NOT DETECTED Final   Staphylococcus epidermidis NOT DETECTED NOT DETECTED Final   Staphylococcus lugdunensis NOT DETECTED NOT DETECTED Final   Streptococcus species NOT DETECTED NOT DETECTED Final   Streptococcus agalactiae NOT DETECTED NOT DETECTED Final   Streptococcus pneumoniae NOT DETECTED NOT DETECTED Final   Streptococcus pyogenes NOT DETECTED NOT DETECTED Final   A.calcoaceticus-baumannii NOT DETECTED NOT DETECTED Final   Bacteroides fragilis NOT DETECTED NOT DETECTED Final   Enterobacterales DETECTED (A) NOT DETECTED Final    Comment: Enterobacterales represent a large order of gram negative bacteria, not a single organism. CRITICAL RESULT CALLED TO, READ BACK BY AND VERIFIED WITH:  Lars Masson, RN 08/25/22 2105 A. LAFRANCE    Enterobacter cloacae complex NOT DETECTED NOT DETECTED Final   Escherichia coli DETECTED (A) NOT DETECTED Final    Comment: CRITICAL RESULT CALLED TO, READ BACK BY AND VERIFIED WITH:  Lars Masson, RN 08/25/22 2105 A. LAFRANCE    Klebsiella aerogenes NOT DETECTED NOT DETECTED Final   Klebsiella oxytoca NOT DETECTED NOT DETECTED Final   Klebsiella pneumoniae NOT DETECTED NOT DETECTED Final   Proteus species NOT DETECTED NOT DETECTED Final   Salmonella species NOT DETECTED NOT DETECTED Final   Serratia marcescens NOT DETECTED NOT DETECTED Final   Haemophilus influenzae NOT DETECTED NOT DETECTED Final   Neisseria meningitidis NOT DETECTED NOT DETECTED Final   Pseudomonas aeruginosa NOT DETECTED NOT DETECTED Final   Stenotrophomonas maltophilia NOT DETECTED NOT DETECTED Final   Candida albicans NOT DETECTED NOT DETECTED Final   Candida auris NOT DETECTED NOT DETECTED  Final   Candida glabrata NOT DETECTED NOT DETECTED Final   Candida krusei NOT DETECTED NOT DETECTED Final   Candida parapsilosis NOT DETECTED NOT DETECTED Final   Candida tropicalis NOT DETECTED NOT DETECTED Final   Cryptococcus neoformans/gattii NOT DETECTED NOT DETECTED Final   CTX-M ESBL NOT DETECTED NOT DETECTED Final   Carbapenem resistance IMP NOT DETECTED NOT DETECTED Final   Carbapenem resistance KPC NOT DETECTED NOT DETECTED Final   Carbapenem resistance NDM NOT DETECTED NOT DETECTED Final   Carbapenem resist OXA 48 LIKE NOT DETECTED NOT DETECTED Final   Carbapenem resistance VIM NOT DETECTED NOT DETECTED Final    Comment: Performed at [facility], [address] [place], [place] [address]  Blood Culture (routine x 2)     Status: None (Preliminary  result)   Collection Time: 08/24/22  8:14 PM   Specimen: BLOOD LEFT HAND  Result Value Ref Range Status   Specimen Description   Final    BLOOD LEFT HAND BOTTLES DRAWN AEROBIC AND ANAEROBIC Performed at [facility], [address], [place], [place] [address]    Special Requests   Final    Blood Culture adequate volume Performed at [facility], [address] [place], [place] [address]    Culture  Setup Time   Final    GRAM NEGATIVE RODS ANAEROBIC BOTTLE ONLY Gram Stain Report Called to,Read Back By and Verified With: HEMZE @ [id number] BY HENDERSON L CRITICAL VALUE NOTED.  VALUE IS CONSISTENT WITH PREVIOUSLY REPORTED AND CALLED VALUE.    Culture   Final    GRAM NEGATIVE RODS IDENTIFICATION TO FOLLOW Performed at [facility], [place] [address] [place], [place] [address]    Report Status PENDING  Incomplete  Resp Panel by RT-PCR (Flu A&B, Covid) Anterior Nasal Swab     Status: None   Collection Time: 08/24/22  8:53 PM   Specimen: Anterior Nasal Swab  Result Value Ref Range Status   SARS Coronavirus 2 by RT PCR NEGATIVE NEGATIVE Final    Comment: (NOTE) SARS-CoV-2 target nucleic acids are NOT DETECTED.  The  SARS-CoV-2 RNA is generally detectable in upper respiratory specimens during the acute phase of infection. The lowest concentration of SARS-CoV-2 viral copies this assay can detect is 138 copies/mL. A negative result does not preclude SARS-Cov-2 infection and should not be used as the sole basis for treatment or other patient management decisions. A negative result may occur with  improper specimen collection/handling, submission of specimen other than nasopharyngeal swab, presence of viral mutation(s) within the areas targeted by this assay, and inadequate number of viral copies(<138 copies/mL). A negative result must be combined with clinical observations, patient history, and epidemiological information. The expected result is Negative.  Fact Sheet for Patients:  EntrepreneurPulse.com.au  Fact Sheet for Healthcare Providers:  IncredibleEmployment.be  This test is no t yet approved or cleared by the [place] FDA and  has been authorized for detection and/or diagnosis of SARS-CoV-2 by FDA under an Emergency Use Authorization (EUA). This EUA will remain  in effect (meaning this test can be used) for the duration of the COVID-19 declaration under Section 564(b)(1) of the Act, 21 U.S.C.section 360bbb-3(b)(1), unless the authorization is terminated  or revoked sooner.       Influenza A by PCR NEGATIVE NEGATIVE Final   Influenza B by PCR NEGATIVE NEGATIVE Final    Comment: (NOTE) The Xpert Xpress SARS-CoV-2/FLU/RSV plus assay is intended as an aid in the diagnosis of influenza from Nasopharyngeal swab specimens and should not be used as a sole basis for treatment. Nasal washings and aspirates are unacceptable for Xpert Xpress SARS-CoV-2/FLU/RSV testing.  Fact Sheet for Patients: EntrepreneurPulse.com.au  Fact Sheet for Healthcare Providers: IncredibleEmployment.be  This test is not yet approved or  cleared by the [place] FDA and has been authorized for detection and/or diagnosis of SARS-CoV-2 by FDA under an Emergency Use Authorization (EUA). This EUA will remain in effect (meaning this test can be used) for the duration of the COVID-19 declaration under Section 564(b)(1) of the Act, 21 U.S.C. section 360bbb-3(b)(1), unless the authorization is terminated or revoked.  Performed at [facility], [address] [place], [place] [address]      Medications:    chlorhexidine  15 mL Mouth/Throat Once   Or  mouth rinse  15 mL Mouth Rinse Once   [MAR Hold] insulin aspart  0-5 Units Subcutaneous QHS   [MAR Hold] insulin aspart  0-9 Units Subcutaneous TID WC   [MAR Hold] insulin glargine-yfgn  8 Units Subcutaneous QHS   Continuous Infusions:  sodium chloride 10 mL/hr at 08/26/22 0920   [MAR Hold] cefTRIAXone (ROCEPHIN)  IV 2 g (08/25/22 2126)   [MAR Hold] metronidazole 500 mg (08/25/22 2123)      LOS: 2 days   Charlynne Cousins  [facility]  08/26/2022, 10:04 AM

## 2022-08-26 NOTE — Progress Notes (Signed)
Patient received from PACU via bed.  Drowsy but easily arousable, A&O x 4.  Spouse at bedside.  Abdominal lap sites x 4, skins glue dry and intact, open to air.  C/o pain 8/10, pain med given as ordered.  Call bell within reach, needs addressed.

## 2022-08-26 NOTE — Progress Notes (Signed)
Pt Bp running low during overnight. 80-90/50-60 MAP 60's. On call provider Zebedee Iba NP aware. Total of 750 bolus given of ns. Pt asymptomatic. Provider aware of blood given 08/25/22 and current hgb, plans for upcoming surgery, and that pt reported amber urine. After 2 bolus equally 750cc. Pt bp remains high 80's and map remained stable. Provider requested to monitor. Cycling blood pressure currently.

## 2022-08-26 NOTE — Anesthesia Preprocedure Evaluation (Addendum)
Anesthesia Evaluation  Patient identified by MRN, date of birth, ID band Patient awake    Reviewed: Allergy & Precautions, H&P , NPO status , Patient's Chart, lab work & pertinent test results  Airway Mallampati: III  TM Distance: >3 FB Neck ROM: Full    Dental no notable dental hx. (+) Teeth Intact, Dental Advisory Given   Pulmonary neg pulmonary ROS   Pulmonary exam normal breath sounds clear to auscultation       Cardiovascular hypertension,  Rhythm:Regular Rate:Normal     Neuro/Psych negative neurological ROS  negative psych ROS   GI/Hepatic negative GI ROS,,,(+) Hepatitis -, C  Endo/Other  diabetes, Type 2, Oral Hypoglycemic Agents    Renal/GU Renal InsufficiencyRenal disease  negative genitourinary   Musculoskeletal   Abdominal   Peds  Hematology  (+) Blood dyscrasia, anemia   Anesthesia Other Findings   Reproductive/Obstetrics negative OB ROS                             Anesthesia Physical Anesthesia Plan  ASA: 3  Anesthesia Plan: General   Post-op Pain Management:    Induction: Intravenous  PONV Risk Score and Plan: 3 and Ondansetron, Dexamethasone and Treatment may vary due to age or medical condition  Airway Management Planned: Oral ETT  Additional Equipment:   Intra-op Plan:   Post-operative Plan: Extubation in OR  Informed Consent: I have reviewed the patients History and Physical, chart, labs and discussed the procedure including the risks, benefits and alternatives for the proposed anesthesia with the patient or authorized representative who has indicated his/her understanding and acceptance.     Dental advisory given  Plan Discussed with: CRNA  Anesthesia Plan Comments:        Anesthesia Quick Evaluation

## 2022-08-26 NOTE — Plan of Care (Signed)

## 2022-08-26 NOTE — Anesthesia Postprocedure Evaluation (Signed)
Anesthesia Post Note  Patient: Kyle Rogers  Procedure(s) Performed: LAPAROSCOPIC CHOLECYSTECTOMY WITH  INTRAOPERATIVE CHOLANGIOGRAM (Abdomen)     Patient location during evaluation: PACU Anesthesia Type: General Level of consciousness: awake and alert Pain management: pain level controlled Vital Signs Assessment: post-procedure vital signs reviewed and stable Respiratory status: spontaneous breathing, nonlabored ventilation, respiratory function stable and patient connected to nasal cannula oxygen Cardiovascular status: blood pressure returned to baseline and stable Postop Assessment: no apparent nausea or vomiting Anesthetic complications: no  No notable events documented.  Last Vitals:  Vitals:   08/26/22 1215 08/26/22 1234  BP: 107/62 (!) 106/59  Pulse: 76 75  Resp: 20 19  Temp: 36.7 C 37.1 C  SpO2: 91% 93%    Last Pain:  Vitals:   08/26/22 1244  TempSrc:   PainSc: 8                  Leeah Politano,W. EDMOND

## 2022-08-26 NOTE — Transfer of Care (Signed)
Immediate Anesthesia Transfer of Care Note  Patient: Kyle Rogers  Procedure(s) Performed: LAPAROSCOPIC CHOLECYSTECTOMY WITH  INTRAOPERATIVE CHOLANGIOGRAM (Abdomen)  Patient Location: PACU  Anesthesia Type:General  Level of Consciousness: awake and alert   Airway & Oxygen Therapy: Patient Spontanous Breathing and Patient connected to face mask oxygen  Post-op Assessment: Report given to RN and Post -op Vital signs reviewed and stable  Post vital signs: Reviewed and stable  Last Vitals:  Vitals Value Taken Time  BP 108/60 08/26/22 1117  Temp    Pulse 74 08/26/22 1122  Resp 20 08/26/22 1122  SpO2 91 % 08/26/22 1122  Vitals shown include unvalidated device data.  Last Pain:  Vitals:   08/26/22 0816  TempSrc: Oral  PainSc:          Complications: No notable events documented.

## 2022-08-26 NOTE — Anesthesia Procedure Notes (Signed)
Procedure Name: Intubation Date/Time: 08/26/2022 9:32 AM  Performed by: Reece Agar, CRNAPre-anesthesia Checklist: Patient identified, Emergency Drugs available, Suction available and Patient being monitored Patient Re-evaluated:Patient Re-evaluated prior to induction Oxygen Delivery Method: Circle System Utilized Preoxygenation: Pre-oxygenation with 100% oxygen Induction Type: IV induction Ventilation: Mask ventilation without difficulty and Oral airway inserted - appropriate to patient size Laryngoscope Size: Glidescope and 4 Grade View: Grade I Tube type: Oral Tube size: 7.0 mm Number of attempts: 1 Airway Equipment and Method: Stylet and Oral airway Placement Confirmation: ETT inserted through vocal cords under direct vision, positive ETCO2 and breath sounds checked- equal and bilateral Secured at: 22 cm Tube secured with: Tape Dental Injury: Teeth and Oropharynx as per pre-operative assessment

## 2022-08-26 NOTE — Op Note (Signed)
PATIENT:  Kyle Rogers  71 y.o. male  PRE-OPERATIVE DIAGNOSIS:  choledocholithiasis  POST-OPERATIVE DIAGNOSIS:  choledocholithiasis  PROCEDURE:  Procedure(s): LAPAROSCOPIC CHOLECYSTECTOMY WITH  INTRAOPERATIVE CHOLANGIOGRAM   SURGEON:  Marissia Blackham, Arta Bruce, MD   ASSISTANT: none  ANESTHESIA:   local and general  Indications for procedure: Kyle Rogers is a 71 y.o. male with symptoms of Abdominal pain and Nausea and vomiting consistent with gallbladder disease, Confirmed by ultrasound, CT, and ERCP .  Description of procedure: The patient was brought into the operative suite, placed supine. Anesthesia was administered with endotracheal tube. Patient was strapped in place and foot board was secured. All pressure points were offloaded by foam padding. The patient was prepped and draped in the usual sterile fashion.  A periumbilical incision was made and optical entry was used to enter the abdomen. 2 5 mm trocars were placed on in the right lateral space on in the right subcostal space. A 63m trocar was placed in the subxiphoid space. Marcaine with Epinephrine was infused to the subxiphoid space and lateral upper right abdomen in the transversus abdominis plane. Next the patient was placed in reverse trendelenberg. The gallbladder appearedfull of stones, dilated, and acutely inflamed. Omentum was adhered to the gallbladder and was taken down with cautery/blunt dissection.  The gallbladder was retracted cephalad and lateral. The peritoneum was reflected off the infundibulum working lateral to medial. The cystic duct and cystic artery were identified and further dissection revealed a critical view, due to concern for choledocholithiasis a cholangiogram was performed with ductotomy and cook catheter passed through a separate subcostal stab incision. The CBD filled and emptied into the duodenum. The cystic duct and cystic artery were doubly clipped and ligated.   The gallbladder was removed  off the liver bed with cautery. The Gallbladder was placed in a specimen bag. The gallbladder fossa was irrigated and hemostasis was applied with cautery. The gallbladder was removed via the 165mtrocar. The fascial defect was closed with interrupted 0 vicryl suture via laparoscopic trans-fascial suture passer. Pneumoperitoneum was removed, all trocar were removed. All incisions were closed with 4-0 monocryl subcuticular stitch. The patient woke from anesthesia and was brought to PACU in stable condition. All counts were correct  Findings: acute cholecystitis, no obstruction on IOC  Specimen: gallbladder  Blood loss: 50 ml  Local anesthesia: 30 ml Marcaine with Epinephrine  Complications: none  PLAN OF CARE: Admit to inpatient   PATIENT DISPOSITION:  PACU - hemodynamically stable.  [facility], PAU[place]

## 2022-08-26 NOTE — Discharge Instructions (Signed)
[facility]  Please arrive at least 30 min before your appointment to complete your check in paperwork.  If you are unable to arrive 30 min prior to your appointment time we may have to cancel or reschedule you. LAPAROSCOPIC SURGERY: POST OP INSTRUCTIONS Always review your discharge instruction sheet given to you by the facility where your surgery was performed. IF YOU HAVE DISABILITY OR FAMILY LEAVE FORMS, YOU MUST BRING THEM TO THE OFFICE FOR PROCESSING.   DO NOT GIVE THEM TO YOUR DOCTOR.  PAIN CONTROL  First take acetaminophen (Tylenol) AND/or ibuprofen (Advil) to control your pain after surgery.  Follow directions on package.  Taking acetaminophen (Tylenol) and/or ibuprofen (Advil) regularly after surgery will help to control your pain and lower the amount of prescription pain medication you may need.  You should not take more than 4,000 mg (4 grams) of acetaminophen (Tylenol) in 24 hours.  You should not take ibuprofen (Advil), aleve, motrin, naprosyn or other NSAIDS if you have a history of stomach ulcers or chronic kidney disease.  A prescription for pain medication may be given to you upon discharge.  Take your pain medication as prescribed, if you still have uncontrolled pain after taking acetaminophen (Tylenol) or ibuprofen (Advil). Use ice packs to help control pain. If you need a refill on your pain medication, please contact your pharmacy.  They will contact our office to request authorization. Prescriptions will not be filled after 5pm or on week-ends.  HOME MEDICATIONS Take your usually prescribed medications unless otherwise directed.  DIET You should follow a light diet the first few days after arrival home.  Be sure to include lots of fluids daily. Avoid fatty, fried foods.   CONSTIPATION It is common to experience some constipation after surgery and if you are taking pain medication.  Increasing fluid intake and taking a stool softener (such as Colace)  will usually help or prevent this problem from occurring.  A mild laxative (Milk of Magnesia or Miralax) should be taken according to package instructions if there are no bowel movements after 48 hours.  WOUND/INCISION CARE Most patients will experience some swelling and bruising in the area of the incisions.  Ice packs will help.  Swelling and bruising can take several days to resolve.  Unless discharge instructions indicate otherwise, follow guidelines below  STERI-STRIPS - you may remove your outer bandages 48 hours after surgery, and you may shower at that time.  You have steri-strips (small skin tapes) in place directly over the incision.  These strips should be left on the skin for 7-10 days.   DERMABOND/SKIN GLUE - you may shower in 24 hours.  The glue will flake off over the next 2-3 weeks. Any sutures or staples will be removed at the office during your follow-up visit.  ACTIVITIES You may resume regular (light) daily activities beginning the next day--such as daily self-care, walking, climbing stairs--gradually increasing activities as tolerated.  You may have sexual intercourse when it is comfortable.  Refrain from any heavy lifting or straining until approved by your doctor. You may drive when you are no longer taking prescription pain medication, you can comfortably wear a seatbelt, and you can safely maneuver your car and apply brakes.  FOLLOW-UP You should see your doctor in the office for a follow-up appointment approximately 2-3 weeks after your surgery.  You should have been given your post-op/follow-up appointment when your surgery was scheduled.  If you did not receive a post-op/follow-up appointment, make sure   that you call for this appointment within a day or two after you arrive home to insure a convenient appointment time.  OTHER INSTRUCTIONS  WHEN TO CALL YOUR DOCTOR: Fever over 101.0 Inability to urinate Continued bleeding from incision. Increased pain, redness, or  drainage from the incision. Increasing abdominal pain  The clinic staff is available to answer your questions during regular business hours.  Please don't hesitate to call and ask to speak to one of the nurses for clinical concerns.  If you have a medical emergency, go to the nearest emergency room or call 911.  A surgeon from [facility] is always on call at the hospital. [address], [address], [place], [place]  [address] ? [address] [address], [place], [place]   [address] [phone] ? [phone] ? [phone] [phone]   

## 2022-08-26 NOTE — Anesthesia Postprocedure Evaluation (Signed)
Anesthesia Post Note  Patient: [name]  Procedure(s) Performed: ENDOSCOPIC RETROGRADE CHOLANGIOPANCREATOGRAPHY (ERCP) REMOVAL OF STONES SPHINCTEROTOMY     Patient location during evaluation: PACU Anesthesia Type: General Level of consciousness: awake and alert Pain management: pain level controlled Vital Signs Assessment: post-procedure vital signs reviewed and stable Respiratory status: spontaneous breathing, nonlabored ventilation and respiratory function stable Cardiovascular status: stable and blood pressure returned to baseline Anesthetic complications: yes   Encounter Notable Events  Notable Event Outcome Phase Comment  Difficult to intubate - expected  Intraprocedure Filed from anesthesia note documentation.    Last Vitals:  Vitals:   08/26/22 0010 08/26/22 0013  BP: (!) 78/50 (!) 88/60  Pulse:    Resp:    Temp:    SpO2:      Last Pain:  Vitals:   08/25/22 1958  TempSrc: Oral  PainSc: 0-No pain                 Kyle Rogers

## 2022-08-26 NOTE — Progress Notes (Signed)
   08/26/22 0010  Assess: MEWS Score  BP (!) 78/50  Assess: MEWS Score  MEWS Temp 0  MEWS Systolic 2  MEWS Pulse 0  MEWS RR 0  MEWS LOC 0  MEWS Score 2  MEWS Score Color Yellow  Assess: if the MEWS score is Yellow or Red  Were vital signs taken at a resting state? Yes  Focused Assessment No change from prior assessment  Does the patient meet 2 or more of the SIRS criteria? No  MEWS guidelines implemented *See Row Information* No, vital signs rechecked  Notify: Charge Nurse/RN  Name of Charge Nurse/RN Notified Josephine  Date Charge Nurse/RN Notified 08/26/22  Time Charge Nurse/RN Notified 0220  Provider Notification  Provider Name/Title A. Zebedee Iba NP  Date Provider Notified 08/26/22  Time Provider Notified 0015  Method of Notification  (amion)  Notification Reason Other (Comment) (BP low mews yellow)  Provider response See new orders (500 bolus)  Date of Provider Response 08/26/22  Time of Provider Response 0025

## 2022-08-27 ENCOUNTER — Encounter (HOSPITAL_COMMUNITY): Payer: Self-pay | Admitting: General Surgery

## 2022-08-27 LAB — CBC
HCT: 19.9 % — ABNORMAL LOW (ref 39.0–52.0)
Hemoglobin: 6.6 g/dL — CL (ref 13.0–17.0)
MCH: 32.4 pg (ref 26.0–34.0)
MCHC: 33.2 g/dL (ref 30.0–36.0)
MCV: 97.5 fL (ref 80.0–100.0)
Platelets: 86 10*3/uL — ABNORMAL LOW (ref 150–400)
RBC: 2.04 MIL/uL — ABNORMAL LOW (ref 4.22–5.81)
RDW: 15.3 % (ref 11.5–15.5)
WBC: 4.2 10*3/uL (ref 4.0–10.5)
nRBC: 0 % (ref 0.0–0.2)

## 2022-08-27 LAB — COMPREHENSIVE METABOLIC PANEL
ALT: 134 U/L — ABNORMAL HIGH (ref 0–44)
AST: 94 U/L — ABNORMAL HIGH (ref 15–41)
Albumin: 2.5 g/dL — ABNORMAL LOW (ref 3.5–5.0)
Alkaline Phosphatase: 137 U/L — ABNORMAL HIGH (ref 38–126)
Anion gap: 14 (ref 5–15)
BUN: 76 mg/dL — ABNORMAL HIGH (ref 8–23)
CO2: 17 mmol/L — ABNORMAL LOW (ref 22–32)
Calcium: 7.4 mg/dL — ABNORMAL LOW (ref 8.9–10.3)
Chloride: 105 mmol/L (ref 98–111)
Creatinine, Ser: 3.52 mg/dL — ABNORMAL HIGH (ref 0.61–1.24)
GFR, Estimated: 18 mL/min — ABNORMAL LOW (ref >60)
Glucose, Bld: 297 mg/dL — ABNORMAL HIGH (ref 70–99)
Potassium: 4.6 mmol/L (ref 3.5–5.1)
Sodium: 136 mmol/L (ref 135–145)
Total Bilirubin: 1.8 mg/dL — ABNORMAL HIGH (ref 0.3–1.2)
Total Protein: 5.3 g/dL — ABNORMAL LOW (ref 6.5–8.1)

## 2022-08-27 LAB — GLUCOSE, CAPILLARY
Glucose-Capillary: 274 mg/dL — ABNORMAL HIGH (ref 70–99)
Glucose-Capillary: 308 mg/dL — ABNORMAL HIGH (ref 70–99)
Glucose-Capillary: 226 mg/dL — ABNORMAL HIGH (ref 70–99)
Glucose-Capillary: 182 mg/dL — ABNORMAL HIGH (ref 70–99)

## 2022-08-27 LAB — PREPARE RBC (CROSSMATCH)

## 2022-08-27 LAB — HEMOGLOBIN AND HEMATOCRIT, BLOOD
HCT: 27.7 % — ABNORMAL LOW (ref 39.0–52.0)
Hemoglobin: 9.7 g/dL — ABNORMAL LOW (ref 13.0–17.0)

## 2022-08-27 MED ORDER — SODIUM CHLORIDE 0.9% IV SOLUTION: Freq: Once | INTRAVENOUS | Status: AC

## 2022-08-27 MED ORDER — MORPHINE SULFATE (PF) 2 MG/ML IV SOLN: 2.0000 mg | INTRAVENOUS | Status: DC | PRN

## 2022-08-27 MED ORDER — ACETAMINOPHEN 500 MG PO TABS: 1000.0000 mg | ORAL_TABLET | Freq: Three times a day (TID) | ORAL | Status: DC | PRN

## 2022-08-27 MED ORDER — INSULIN ASPART 100 UNIT/ML IJ SOLN
3.0000 [IU] | Freq: Three times a day (TID) | INTRAMUSCULAR | Status: DC
  Administered 2022-08-27 – 2022-08-28 (×3): 3 [IU] via SUBCUTANEOUS

## 2022-08-27 MED ORDER — INSULIN GLARGINE-YFGN 100 UNIT/ML ~~LOC~~ SOLN
8.0000 [IU] | Freq: Two times a day (BID) | SUBCUTANEOUS | Status: DC
  Administered 2022-08-27 (×2): 8 [IU] via SUBCUTANEOUS
  Filled 2022-08-27 (×4): qty 0.08

## 2022-08-27 MED ORDER — INSULIN ASPART 100 UNIT/ML IJ SOLN
0.0000 [IU] | Freq: Three times a day (TID) | INTRAMUSCULAR | Status: DC
  Administered 2022-08-27: 7 [IU] via SUBCUTANEOUS
  Administered 2022-08-27 – 2022-08-28 (×2): 3 [IU] via SUBCUTANEOUS

## 2022-08-27 MED ORDER — INSULIN ASPART 100 UNIT/ML IJ SOLN: 0.0000 [IU] | Freq: Every day | INTRAMUSCULAR | Status: DC

## 2022-08-27 MED ORDER — OXYCODONE HCL 5 MG PO TABS: 5.0000 mg | ORAL_TABLET | ORAL | Status: DC | PRN

## 2022-08-27 MED ORDER — TRAMADOL HCL 50 MG PO TABS
50.0000 mg | ORAL_TABLET | Freq: Four times a day (QID) | ORAL | Status: DC | PRN
  Administered 2022-08-27 (×2): 50 mg via ORAL
  Filled 2022-08-27 (×2): qty 1

## 2022-08-27 MED ORDER — LIDOCAINE VISCOUS HCL 2 % MT SOLN
OROMUCOSAL | Status: AC
  Filled 2022-08-27: qty 15

## 2022-08-27 MED ORDER — INSULIN ASPART 100 UNIT/ML IJ SOLN: 4.0000 [IU] | Freq: Three times a day (TID) | INTRAMUSCULAR | Status: DC

## 2022-08-27 NOTE — Progress Notes (Signed)
[facility] PROGRESS NOTE    Progress Note  Kyle Rogers  [id number] DOB: 12-May-1951 DOA: 08/24/2022 PCP: Wenda Low, MD     Brief Narrative:   Kyle Rogers is an 71 y.o. male past medical history significant for type 2 diabetes mellitus, essential hypertension comes in for 4 days of abdominal pain worse after eating went to see his PCP LFTs were significantly elevated, was also having fevers sent to the ED, CT scan showed gallbladder wall thickening, gallbladder is and concern for acute cholecystitis was started on IV fluids IV antibiotics GI was consulted [facility] who recommended the patient be transferred to [facility] for possible ERCP, Carol Ada to see.   Assessment/Plan:   Severe sepsis secondary to acute cholecystitis/acute cholangitis: Blood cultures grew E. coli.  Has remained afebrile leukocytosis improved. LFTs are significantly improved. Blood culture sensitivities are pending, discontinue Flagyl. Status post lap chole on 08/26/2022 surgery advanced his diet. They have signed off.  Normocytic anemia: He denies any melanotic stools or bright red blood per rectum. He status post 1 unit of packed red blood cells prior to surgical intervention. CBC this morning 6.6, we will go ahead and transfuse 2 units of packed red blood cells recheck CBC posttransfusion 1.  Elevated LFT's: Likely due to above.  Diabetes mellitus type 2 uncontrolled with hyperglycemia: Currently on a diet awaiting a well became hyperglycemic increase his long-acting insulin start him on sliding scale with meal coverage.  Essential hypertension: Blood pressure soft hold all antihypertensive medication.  Hyperlipidemia: Noted.  Chronic kidney disease stage IV Creatinine appears to be at baseline.    DVT prophylaxis: lovenox Family Communication:none Status is: Inpatient Remains inpatient appropriate because: Acute cholecystitis    Code Status:     Code Status  Orders  (From admission, onward)           Start     Ordered   08/25/22 0028  Full code  Continuous        08/25/22 0027           Code Status History     This patient has a current code status but no historical code status.         IV Access:   Peripheral IV   Procedures and diagnostic studies:   DG Cholangiogram Operative  Result Date: 08/26/2022 CLINICAL DATA:  History of choledocholithiasis status post ERCP. Patient now undergoing cholecystectomy. EXAM: INTRAOPERATIVE CHOLANGIOGRAM TECHNIQUE: Cholangiographic images from the C-arm fluoroscopic device were submitted for interpretation post-operatively. Please see the procedural report for the amount of contrast and the fluoroscopy time utilized. FLUOROSCOPY: Radiation Exposure Index (as provided by the fluoroscopic device): 3.14 mGy Kerma COMPARISON:  ERCP images 08/25/22 FINDINGS: Borderline nondiagnostic image due to significant over penetration. Contrast is visualized within the distal common bile duct and passing through the ampulla and into the duodenum. IMPRESSION: Borderline nondiagnostic single image due to significant over penetration. The biliary sphincter appears patent. Contrast material partially fills a structure adjacent to the ampulla which may represent a duodenal diverticulum. Electronically Signed   By: Jacqulynn Cadet M.D.   On: 08/26/2022 11:09   DG C-Arm 1-60 Min-No Report  Result Date: 08/26/2022 Fluoroscopy was utilized by the requesting physician.  No radiographic interpretation.   DG ERCP  Result Date: 08/25/2022 CLINICAL DATA:  71 year old male with history of distal common bile duct obstruction. EXAM: ERCP TECHNIQUE: Multiple spot images obtained with the fluoroscopic device and submitted for interpretation post-procedure. FLUOROSCOPY TIME:  338.76  mGy COMPARISON:  08/24/2022, 08/25/2022 FINDINGS: Endoscope is positioned in the second portion the duodenum. There is retrograde cannulation of  the common bile duct. Retrograde cholangiogram demonstrates moderate extrahepatic biliary ductal dilation with rounded filling defect near the ampulla. Balloon sweep and sphincterotomy were performed with relative similar appearance of the common bile duct on final image. IMPRESSION: Distal common bile duct obstruction with intervention. These images were submitted for radiologic interpretation only. Please see the procedural report for the full procedural details, amount of contrast, and the fluoroscopy time utilized. Ruthann Cancer, MD [facility] Radiology Electronically Signed   By: Ruthann Cancer M.D.   On: 08/25/2022 13:51     Medical Consultants:   None.   Subjective:    Kyle Rogers relates his abdominal pain is improved tolerating his diet.  Objective:    Vitals:   08/26/22 1527 08/26/22 1938 08/27/22 0530 08/27/22 0742  BP: 102/69 136/80 (!) 96/59 107/73  Pulse: 73 94 65 64  Resp: '18 18 20 17  '$ Temp: (!) 97.5 F (36.4 C) 97.9 F (36.6 C) 97.6 F (36.4 C) 97.8 F (36.6 C)  TempSrc:   Oral Oral  SpO2: 95% 97% 98% 98%  Weight:      Height:       SpO2: 98 % O2 Flow Rate (L/min): 2 L/min   Intake/Output Summary (Last 24 hours) at 08/27/2022 0915 Last data filed at 08/27/2022 0743 Gross per 24 hour  Intake 2429.21 ml  Output 1820 ml  Net 609.21 ml    Filed Weights   08/24/22 1748  Weight: 67.6 kg    Exam: General exam: In no acute distress. Respiratory system: Good air movement and clear to auscultation. Cardiovascular system: S1 & S2 heard, RRR. No JVD. Gastrointestinal system: Abdomen is nondistended, soft and nontender.  Extremities: No pedal edema. Skin: No rashes, lesions or ulcers Psychiatry: Judgement and insight appear normal. Mood & affect appropriate.  Data Reviewed:    Labs: Basic Metabolic Panel: Recent Labs  Lab 08/24/22 1812 08/25/22 0208 08/27/22 0341  NA 136 139 136  K 3.7 3.9 4.6   CL 101 104 105  CO2 19* 21* 17*  GLUCOSE 211* 190* 297*  BUN 45* 52* 76*  CREATININE 3.18* 3.27* 3.52*  CALCIUM 8.0* 7.9* 7.4*  MG  --  1.3*  --   PHOS  --  5.7*  --     GFR Estimated Creatinine Clearance: 17.4 mL/min (A) (by C-G formula based on SCr of 3.52 mg/dL (H)). Liver Function Tests: Recent Labs  Lab 08/24/22 1812 08/25/22 0208 08/27/22 0341  AST 512* 527* 94*  ALT 269* 232* 134*  ALKPHOS 259* 197* 137*  BILITOT 5.6* 4.4* 1.8*  PROT 7.0 5.6* 5.3*  ALBUMIN 3.3* 2.5* 2.5*    Recent Labs  Lab 08/24/22 1812  LIPASE 32    No results for input(s): "AMMONIA" in the last 168 hours. Coagulation profile Recent Labs  Lab 08/24/22 2000  INR 1.5*    COVID-19 Labs  No results for input(s): "DDIMER", "FERRITIN", "LDH", "CRP" in the last 72 hours.  Lab Results  Component Value Date   [place] NEGATIVE 08/24/2022    CBC: Recent Labs  Lab 08/24/22 1812 08/25/22 0208 08/25/22 1831 08/27/22 0341  WBC 17.1* 11.1* 8.5 4.2  NEUTROABS 16.5*  --   --   --   HGB 8.3* 7.0* 7.9* 6.6*  HCT 24.3* 19.3* 22.6* 19.9*  MCV 98.4 95.5 95.0 97.5  PLT 159 103* 80*  86*    Cardiac Enzymes: No results for input(s): "CKTOTAL", "CKMB", "CKMBINDEX", "TROPONINI" in the last 168 hours. BNP (last 3 results) No results for input(s): "PROBNP" in the last 8760 hours. CBG: Recent Labs  Lab 08/26/22 0751 08/26/22 1120 08/26/22 1548 08/26/22 1942 08/27/22 0748  GLUCAP 115* 127* 160* 271* 274*    D-Dimer: No results for input(s): "DDIMER" in the last 72 hours. Hgb A1c: No results for input(s): "HGBA1C" in the last 72 hours. Lipid Profile: No results for input(s): "CHOL", "HDL", "LDLCALC", "TRIG", "CHOLHDL", "LDLDIRECT" in the last 72 hours. Thyroid function studies: No results for input(s): "TSH", "T4TOTAL", "T3FREE", "THYROIDAB" in the last 72 hours.  Invalid input(s): "FREET3" Anemia work up: No results for input(s): "VITAMINB12", "FOLATE", "FERRITIN", "TIBC",  "IRON", "RETICCTPCT" in the last 72 hours. Sepsis Labs: Recent Labs  Lab 08/24/22 1812 08/24/22 2014 08/25/22 0208 08/25/22 1831 08/27/22 0341  WBC 17.1*  --  11.1* 8.5 4.2  LATICACIDVEN 3.8* 3.0* 1.6  --   --     Microbiology Recent Results (from the past 240 hour(s))  Blood Culture (routine x 2)     Status: Abnormal (Preliminary result)   Collection Time: 08/24/22  8:00 PM   Specimen: Left Antecubital; Blood  Result Value Ref Range Status   Specimen Description   Final    LEFT ANTECUBITAL BLOOD Performed at [facility] [address] [place], [place] [address]    Special Requests   Final    Blood Culture adequate volume Performed at [facility], [address] [place], [place] [address]    Culture  Setup Time   Final    GRAM NEGATIVE RODS IN BOTH AEROBIC AND ANAEROBIC BOTTLES Gram Stain Report Called to,Read Back By and Verified With: HEMZE @ 0938 ON [id number] BY HENDERSON L RESULTS PREVIOULSLY CALLED. CRITICAL RESULT CALLED TO, READ BACK BY AND VERIFIED WITH:  Lars Masson, RN 08/25/22 2105 A. LAFRANCE    Culture (A)  Final    ESCHERICHIA COLI CULTURE REINCUBATED FOR BETTER GROWTH Performed at [facility], [place] [address], [place],  [address]    Report Status PENDING  Incomplete  Blood Culture ID Panel (Reflexed)     Status: Abnormal   Collection Time: 08/24/22  8:00 PM  Result Value Ref Range Status   Enterococcus faecalis NOT DETECTED NOT DETECTED Final   Enterococcus Faecium NOT DETECTED NOT DETECTED Final   Listeria monocytogenes NOT DETECTED NOT DETECTED Final   Staphylococcus species NOT DETECTED NOT DETECTED Final   Staphylococcus aureus (BCID) NOT DETECTED NOT DETECTED Final   Staphylococcus epidermidis NOT DETECTED NOT DETECTED Final   Staphylococcus lugdunensis NOT DETECTED NOT DETECTED Final   Streptococcus species NOT DETECTED NOT DETECTED Final   Streptococcus agalactiae NOT DETECTED NOT DETECTED Final   Streptococcus  pneumoniae NOT DETECTED NOT DETECTED Final   Streptococcus pyogenes NOT DETECTED NOT DETECTED Final   A.calcoaceticus-baumannii NOT DETECTED NOT DETECTED Final   Bacteroides fragilis NOT DETECTED NOT DETECTED Final   Enterobacterales DETECTED (A) NOT DETECTED Final    Comment: Enterobacterales represent a large order of gram negative bacteria, not a single organism. CRITICAL RESULT CALLED TO, READ BACK BY AND VERIFIED WITH:  Lars Masson, RN 08/25/22 2105 A. LAFRANCE    Enterobacter cloacae complex NOT DETECTED NOT DETECTED Final   Escherichia coli DETECTED (A) NOT DETECTED Final    Comment: CRITICAL RESULT CALLED TO, READ BACK BY AND VERIFIED WITH:  Lars Masson, RN 08/25/22 2105 A.  LAFRANCE    Klebsiella aerogenes NOT DETECTED NOT DETECTED Final   Klebsiella oxytoca NOT DETECTED NOT DETECTED Final   Klebsiella pneumoniae NOT DETECTED NOT DETECTED Final   Proteus species NOT DETECTED NOT DETECTED Final   Salmonella species NOT DETECTED NOT DETECTED Final   Serratia marcescens NOT DETECTED NOT DETECTED Final   Haemophilus influenzae NOT DETECTED NOT DETECTED Final   Neisseria meningitidis NOT DETECTED NOT DETECTED Final   Pseudomonas aeruginosa NOT DETECTED NOT DETECTED Final   Stenotrophomonas maltophilia NOT DETECTED NOT DETECTED Final   Candida albicans NOT DETECTED NOT DETECTED Final   Candida auris NOT DETECTED NOT DETECTED Final   Candida glabrata NOT DETECTED NOT DETECTED Final   Candida krusei NOT DETECTED NOT DETECTED Final   Candida parapsilosis NOT DETECTED NOT DETECTED Final   Candida tropicalis NOT DETECTED NOT DETECTED Final   Cryptococcus neoformans/gattii NOT DETECTED NOT DETECTED Final   CTX-M ESBL NOT DETECTED NOT DETECTED Final   Carbapenem resistance IMP NOT DETECTED NOT DETECTED Final   Carbapenem resistance KPC NOT DETECTED NOT DETECTED Final   Carbapenem resistance NDM NOT DETECTED NOT DETECTED Final   Carbapenem resist OXA 48 LIKE NOT DETECTED NOT  DETECTED Final   Carbapenem resistance VIM NOT DETECTED NOT DETECTED Final    Comment: Performed at [facility], [address], [place], [place] [address]  Blood Culture (routine x 2)     Status: Abnormal (Preliminary result)   Collection Time: 08/24/22  8:14 PM   Specimen: BLOOD LEFT HAND  Result Value Ref Range Status   Specimen Description   Final    BLOOD LEFT HAND BOTTLES DRAWN AEROBIC AND ANAEROBIC Performed at [facility], [address], [place], [place] [address]    Special Requests   Final    Blood Culture adequate volume Performed at [facility], [address], [place], [place] [address]    Culture  Setup Time   Final    GRAM NEGATIVE RODS ANAEROBIC BOTTLE ONLY Gram Stain Report Called to,Read Back By and Verified With: HEMZE @ 0998 [id number] BY HENDERSON L CRITICAL VALUE NOTED.  VALUE IS CONSISTENT WITH PREVIOUSLY REPORTED AND CALLED VALUE. Performed at [facility], [place] [address] [place], [place] [address]    Culture ESCHERICHIA COLI (A)  Final   Report Status PENDING  Incomplete  Resp Panel by RT-PCR (Flu A&B, Covid) Anterior Nasal Swab     Status: None   Collection Time: 08/24/22  8:53 PM   Specimen: Anterior Nasal Swab  Result Value Ref Range Status   SARS Coronavirus 2 by RT PCR NEGATIVE NEGATIVE Final    Comment: (NOTE) SARS-CoV-2 target nucleic acids are NOT DETECTED.  The SARS-CoV-2 RNA is generally detectable in upper respiratory specimens during the acute phase of infection. The lowest concentration of SARS-CoV-2 viral copies this assay can detect is 138 copies/mL. A negative result does not preclude SARS-Cov-2 infection and should not be used as the sole basis for treatment or other patient management decisions. A negative result may occur with  improper specimen collection/handling, submission of specimen other than nasopharyngeal swab, presence of viral mutation(s) within the areas targeted by this assay, and inadequate number of  viral copies(<138 copies/mL). A negative result must be combined with clinical observations, patient history, and epidemiological information. The expected result is Negative.  Fact Sheet for Patients:  EntrepreneurPulse.com.au  Fact Sheet for Healthcare Providers:  IncredibleEmployment.be  This test is no t yet approved or cleared by the [place]  FDA and  has been authorized for detection and/or diagnosis of SARS-CoV-2 by FDA under an Emergency Use Authorization (EUA). This EUA will remain  in effect (meaning this test can be used) for the duration of the COVID-19 declaration under Section 564(b)(1) of the Act, 21 U.S.C.section 360bbb-3(b)(1), unless the authorization is terminated  or revoked sooner.       Influenza A by PCR NEGATIVE NEGATIVE Final   Influenza B by PCR NEGATIVE NEGATIVE Final    Comment: (NOTE) The Xpert Xpress SARS-CoV-2/FLU/RSV plus assay is intended as an aid in the diagnosis of influenza from Nasopharyngeal swab specimens and should not be used as a sole basis for treatment. Nasal washings and aspirates are unacceptable for Xpert Xpress SARS-CoV-2/FLU/RSV testing.  Fact Sheet for Patients: EntrepreneurPulse.com.au  Fact Sheet for Healthcare Providers: IncredibleEmployment.be  This test is not yet approved or cleared by the [place] FDA and has been authorized for detection and/or diagnosis of SARS-CoV-2 by FDA under an Emergency Use Authorization (EUA). This EUA will remain in effect (meaning this test can be used) for the duration of the COVID-19 declaration under Section 564(b)(1) of the Act, 21 U.S.C. section 360bbb-3(b)(1), unless the authorization is terminated or revoked.  Performed at [facility], [address] [place], [place] [address]   Urine Culture     Status: Abnormal   Collection Time: 08/24/22  8:53 PM   Specimen: In/Out Cath Urine  Result Value Ref  Range Status   Specimen Description   Final    IN/OUT CATH URINE Performed at [facility], [address] [place], [place] [address]    Special Requests   Final    NONE Performed at [facility], [address] [place], [place] [address]    Culture MULTIPLE SPECIES PRESENT, SUGGEST RECOLLECTION (A)  Final   Report Status 08/26/2022 FINAL  Final     Medications:    insulin aspart  0-5 Units Subcutaneous QHS   insulin aspart  0-9 Units Subcutaneous TID WC   insulin glargine-yfgn  8 Units Subcutaneous QHS   Continuous Infusions:  cefTRIAXone (ROCEPHIN)  IV 2 g (08/26/22 2111)   metronidazole 500 mg (08/26/22 2221)      LOS: 3 days   Kyle Rogers  [facility]  08/27/2022, 9:15 AM

## 2022-08-27 NOTE — Progress Notes (Signed)
Mobility Specialist - Progress Note   08/27/22 0955  Mobility  Activity Refused mobility    Pt received in bed, stated he has been experiencing chest pain w/ ambulation. Will follow up as schedule allows. Left in bed w/ RN at bedside.   [place] Specialist Please contact via SecureChat or Rehab office at [phone]

## 2022-08-27 NOTE — Care Management Important Message (Signed)
Important Message  Patient Details  Name: Kyle Rogers MRN: [id number] Date of Birth: 02-09-1951   Medicare Important Message Given:  Yes     Hannah Beat 08/27/2022, 1:09 PM

## 2022-08-27 NOTE — Progress Notes (Addendum)
Seen and agree. Abd soft. Advance diet. Home when medically stable  1 Day Post-Op  Subjective: CC: Dull soreness around incisions when he moves. Well controlled with pain medications but still using IV pain medications. No po options. Tolerating diet without n/v. Passing flatus. Voiding.  Had some dizziness and generalized cp without associated sob or nausea when ambulating in halls yesterday. Resolved with rest. Does no radiate to neck or down arms. No further chest pains today. Has not been oob. Reports similar symptoms prior to admission as well. Denies hx of  Afebrile overnight. No tachycardia. Soft BP this am of 96/59. Resolved on recheck, 107/73. On RA.  Lives at home with his wife in [place]. Retired in 2017.   Objective: Vital signs in last 24 hours: Temp:  [97.5 F (36.4 C)-98.7 F (37.1 C)] 97.8 F (36.6 C) (12/04 0742) Pulse Rate:  [64-94] 64 (12/04 0742) Resp:  [13-21] 17 (12/04 0742) BP: (96-136)/(59-80) 107/73 (12/04 0742) SpO2:  [90 %-98 %] 98 % (12/04 0742) Last BM Date : 08/24/22  Intake/Output from previous day: 12/03 0701 - 12/04 0700 In: 2529.2 [P.O.:240; I.V.:1489.2; IV Piggyback:800] Out: 1520 [Urine:1500; Blood:20] Intake/Output this shift: Total I/O In: -  Out: 300 [Urine:300]  PE: Gen:  Alert, NAD, pleasant Card:  Reg Pulm:  CTAB, no W/R/R, effort normal Abd: Soft, mild distension, appropriately tender around laparoscopic incisions, no rigidity or guarding and otherwise NT, +BS. Incisions with glue intact appears well and are without drainage, bleeding, or signs of infection  Ext:  No LE edema, SCDs in place Psych: A&Ox3  Skin: no rashes noted, warm and dry  Lab Results:  Recent Labs    08/25/22 1831 08/27/22 0341  WBC 8.5 4.2  HGB 7.9* 6.6*  HCT 22.6* 19.9*  PLT 80* 86*   BMET Recent Labs    08/25/22 0208 08/27/22 0341  NA 139 136  K 3.9 4.6  CL 104 105  CO2 21* 17*  GLUCOSE 190* 297*  BUN 52* 76*  CREATININE 3.27* 3.52*  CALCIUM  7.9* 7.4*   PT/INR Recent Labs    08/24/22 2000  LABPROT 18.2*  INR 1.5*   CMP     Component Value Date/Time   NA 136 08/27/2022 0341   K 4.6 08/27/2022 0341   CL 105 08/27/2022 0341   CO2 17 (L) 08/27/2022 0341   GLUCOSE 297 (H) 08/27/2022 0341   BUN 76 (H) 08/27/2022 0341   CREATININE 3.52 (H) 08/27/2022 0341   CALCIUM 7.4 (L) 08/27/2022 0341   PROT 5.3 (L) 08/27/2022 0341   ALBUMIN 2.5 (L) 08/27/2022 0341   AST 94 (H) 08/27/2022 0341   ALT 134 (H) 08/27/2022 0341   ALKPHOS 137 (H) 08/27/2022 0341   BILITOT 1.8 (H) 08/27/2022 0341   GFRNONAA 18 (L) 08/27/2022 0341   Lipase     Component Value Date/Time   LIPASE 32 08/24/2022 1812    Studies/Results: DG Cholangiogram Operative  Result Date: 08/26/2022 CLINICAL DATA:  History of choledocholithiasis status post ERCP. Patient now undergoing cholecystectomy. EXAM: INTRAOPERATIVE CHOLANGIOGRAM TECHNIQUE: Cholangiographic images from the C-arm fluoroscopic device were submitted for interpretation post-operatively. Please see the procedural report for the amount of contrast and the fluoroscopy time utilized. FLUOROSCOPY: Radiation Exposure Index (as provided by the fluoroscopic device): 3.14 mGy Kerma COMPARISON:  ERCP images 08/25/22 FINDINGS: Borderline nondiagnostic image due to significant over penetration. Contrast is visualized within the distal common bile duct and passing through the ampulla and into the duodenum. IMPRESSION: Borderline nondiagnostic single  image due to significant over penetration. The biliary sphincter appears patent. Contrast material partially fills a structure adjacent to the ampulla which may represent a duodenal diverticulum. Electronically Signed   By: Jacqulynn Cadet M.D.   On: 08/26/2022 11:09   DG C-Arm 1-60 Min-No Report  Result Date: 08/26/2022 Fluoroscopy was utilized by the requesting physician.  No radiographic interpretation.   DG ERCP  Result Date: 08/25/2022 CLINICAL DATA:   71 year old male with history of distal common bile duct obstruction. EXAM: ERCP TECHNIQUE: Multiple spot images obtained with the fluoroscopic device and submitted for interpretation post-procedure. FLUOROSCOPY TIME:  338.76 mGy COMPARISON:  08/24/2022, 08/25/2022 FINDINGS: Endoscope is positioned in the second portion the duodenum. There is retrograde cannulation of the common bile duct. Retrograde cholangiogram demonstrates moderate extrahepatic biliary ductal dilation with rounded filling defect near the ampulla. Balloon sweep and sphincterotomy were performed with relative similar appearance of the common bile duct on final image. IMPRESSION: Distal common bile duct obstruction with intervention. These images were submitted for radiologic interpretation only. Please see the procedural report for the full procedural details, amount of contrast, and the fluoroscopy time utilized. Ruthann Cancer, MD [facility] Radiology Electronically Signed   By: Ruthann Cancer M.D.   On: 08/25/2022 13:51    Anti-infectives: Anti-infectives (From admission, onward)    Start     Dose/Rate Route Frequency Ordered Stop   08/25/22 2200  cefTRIAXone (ROCEPHIN) 2 g in sodium chloride 0.9 % 100 mL IVPB        2 g 200 mL/hr over 30 Minutes Intravenous Every 24 hours 08/25/22 0006     08/25/22 1000  metroNIDAZOLE (FLAGYL) IVPB 500 mg        500 mg 100 mL/hr over 60 Minutes Intravenous Every 12 hours 08/25/22 0006     08/24/22 1930  cefTRIAXone (ROCEPHIN) 2 g in sodium chloride 0.9 % 100 mL IVPB        2 g 200 mL/hr over 30 Minutes Intravenous  Once 08/24/22 1916 08/24/22 2055   08/24/22 1930  metroNIDAZOLE (FLAGYL) IVPB 500 mg        500 mg 100 mL/hr over 60 Minutes Intravenous  Once 08/24/22 1916 08/24/22 2202      ERCP Impression 12/2 - 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 - The major papilla appeared to be exuding mucin. - A filling defect consistent  with a stone was seen on the cholangiogram. - Choledocholithiasis was found. Complete removal was accomplished by biliary sphincterotomy and balloon extraction. - A biliary sphincterotomy was performed. - The biliary tree was swept.  Assessment/Plan POD 1 s/p lap chole with IOC by Dr. Kieth Brightly - 08/26/22 - Underwent ERCP pre-op with Choledocholithiasis found and complete removal accomplished by biliary sphincterotomy and balloon extraction. - IOC intra-op findings: "CBD filled and emptied into the duodenum" - LFT's downtreding with T.Bili 1.8 (from 4.4) - Multimodal pain control, add po options - Mobilize, pulm toilet  FEN - HH VTE - SCDs, on hold for anemia ID - Rocephin/Flagyl. Defer abx choice and duration to hospitalist for bactermia. Afebrile. WBC 4.2 Foley - None, voiding.  Plan - Abx for bacteremia per TRH. Adjust pain meds.   - Per [facility] -  E. Coli Bacteremia  Acute on chronic anemia - S/p 1U 12/2. Hgb 6.6 this am from 7.9. Symptomatic with dizziness when he stands/ambulates. Notes he has followed with hematologist as outpatient previously. Discussed with primary team who has ordered a unit  of PRBC Thrombocytopenia CKD4 HTN HLD Chest pain - Discussed with [facility] about this.    LOS: 3 days    Jillyn Ledger , [facility] Surgery 08/27/2022, 8:05 AM Please see Amion for pager number during day hours 7:00am-4:30pm

## 2022-08-27 NOTE — Progress Notes (Signed)
   08/27/22 8377  Provider Notification  Provider Name/Title Gershon Cull, NP  Date Provider Notified 08/27/22  Time Provider Notified [phone]  Method of Notification Page  Notification Reason Critical Result  Test performed and critical result HGB 6.6  Date Critical Result Received 08/27/22  Time Critical Result Received [phone]

## 2022-08-27 NOTE — Plan of Care (Signed)

## 2022-08-27 NOTE — Progress Notes (Signed)
   08/27/22 0617  Provider Notification  Provider response No new orders  Date of Provider Response 08/27/22  Time of Provider Response [phone]

## 2022-08-28 ENCOUNTER — Other Ambulatory Visit (HOSPITAL_COMMUNITY): Payer: Self-pay

## 2022-08-28 DIAGNOSIS — B9620 Unspecified Escherichia coli [E. coli] as the cause of diseases classified elsewhere: Secondary | ICD-10-CM

## 2022-08-28 DIAGNOSIS — R7881 Bacteremia: Secondary | ICD-10-CM

## 2022-08-28 LAB — TYPE AND SCREEN
ABO/RH(D): B POS
Antibody Screen: NEGATIVE
Unit division: 0
Unit division: 0
Unit division: 0

## 2022-08-28 LAB — BPAM RBC
Blood Product Expiration Date: 202312272359
Blood Product Expiration Date: 202401102359
Blood Product Expiration Date: 202401102359
ISSUE DATE / TIME: 202312021043
ISSUE DATE / TIME: 202312040944
ISSUE DATE / TIME: 202312041218
Unit Type and Rh: 7300
Unit Type and Rh: 7300
Unit Type and Rh: 7300

## 2022-08-28 LAB — COMPREHENSIVE METABOLIC PANEL
ALT: 99 U/L — ABNORMAL HIGH (ref 0–44)
AST: 48 U/L — ABNORMAL HIGH (ref 15–41)
Albumin: 2.4 g/dL — ABNORMAL LOW (ref 3.5–5.0)
Alkaline Phosphatase: 131 U/L — ABNORMAL HIGH (ref 38–126)
Anion gap: 12 (ref 5–15)
BUN: 78 mg/dL — ABNORMAL HIGH (ref 8–23)
CO2: 19 mmol/L — ABNORMAL LOW (ref 22–32)
Calcium: 8.5 mg/dL — ABNORMAL LOW (ref 8.9–10.3)
Chloride: 108 mmol/L (ref 98–111)
Creatinine, Ser: 3.09 mg/dL — ABNORMAL HIGH (ref 0.61–1.24)
GFR, Estimated: 21 mL/min — ABNORMAL LOW (ref >60)
Glucose, Bld: 146 mg/dL — ABNORMAL HIGH (ref 70–99)
Potassium: 4.4 mmol/L (ref 3.5–5.1)
Sodium: 139 mmol/L (ref 135–145)
Total Bilirubin: 1 mg/dL (ref 0.3–1.2)
Total Protein: 5.3 g/dL — ABNORMAL LOW (ref 6.5–8.1)

## 2022-08-28 LAB — CULTURE, BLOOD (ROUTINE X 2)
Special Requests: ADEQUATE
Special Requests: ADEQUATE

## 2022-08-28 LAB — CBC
HCT: 25.7 % — ABNORMAL LOW (ref 39.0–52.0)
Hemoglobin: 9 g/dL — ABNORMAL LOW (ref 13.0–17.0)
MCH: 32.1 pg (ref 26.0–34.0)
MCHC: 35 g/dL (ref 30.0–36.0)
MCV: 91.8 fL (ref 80.0–100.0)
Platelets: 93 10*3/uL — ABNORMAL LOW (ref 150–400)
RBC: 2.8 MIL/uL — ABNORMAL LOW (ref 4.22–5.81)
RDW: 16 % — ABNORMAL HIGH (ref 11.5–15.5)
WBC: 8.5 10*3/uL (ref 4.0–10.5)
nRBC: 0 % (ref 0.0–0.2)

## 2022-08-28 LAB — GLUCOSE, CAPILLARY
Glucose-Capillary: 223 mg/dL — ABNORMAL HIGH (ref 70–99)
Glucose-Capillary: 191 mg/dL — ABNORMAL HIGH (ref 70–99)

## 2022-08-28 LAB — SURGICAL PATHOLOGY

## 2022-08-28 MED ORDER — AMOXICILLIN-POT CLAVULANATE 500-125 MG PO TABS
1.0000 | ORAL_TABLET | Freq: Two times a day (BID) | ORAL | 0 refills | Status: AC
  Filled 2022-08-28: qty 14, 7d supply, fill #0

## 2022-08-28 MED ORDER — AMOXICILLIN-POT CLAVULANATE 875-125 MG PO TABS
1.0000 | ORAL_TABLET | Freq: Two times a day (BID) | ORAL | 0 refills | Status: DC
  Filled 2022-08-28: qty 14, 7d supply, fill #0

## 2022-08-28 MED ORDER — INSULIN GLARGINE-YFGN 100 UNIT/ML ~~LOC~~ SOLN
12.0000 [IU] | Freq: Two times a day (BID) | SUBCUTANEOUS | Status: DC
  Filled 2022-08-28 (×2): qty 0.12

## 2022-08-28 MED ORDER — TRAMADOL HCL 50 MG PO TABS
50.0000 mg | ORAL_TABLET | Freq: Four times a day (QID) | ORAL | 0 refills | Status: AC | PRN
  Filled 2022-08-28: qty 15, 4d supply, fill #0

## 2022-08-28 MED ORDER — AMOXICILLIN-POT CLAVULANATE 500-125 MG PO TABS
1.0000 | ORAL_TABLET | Freq: Two times a day (BID) | ORAL | Status: DC
  Filled 2022-08-28: qty 1

## 2022-08-28 MED ORDER — AMOXICILLIN-POT CLAVULANATE 875-125 MG PO TABS
1.0000 | ORAL_TABLET | Freq: Two times a day (BID) | ORAL | Status: DC
  Administered 2022-08-28: 1 via ORAL
  Filled 2022-08-28: qty 1

## 2022-08-28 NOTE — Plan of Care (Signed)

## 2022-08-28 NOTE — Progress Notes (Addendum)
2 Days Post-Op  Subjective: CC: Pain near incisions is well controlled with Ultram. Tolerating diet without n/v. Passing flatus. Voiding. Mobilizing in room without difficulty.   Objective: Vital signs in last 24 hours: Temp:  [97.5 F (36.4 C)-98 F (36.7 C)] 97.5 F (36.4 C) (12/05 0505) Pulse Rate:  [53-97] 53 (12/05 0505) Resp:  [16-18] 18 (12/05 0505) BP: (108-146)/(64-89) 111/71 (12/05 0505) SpO2:  [95 %-98 %] 97 % (12/05 0505) Last BM Date : 08/24/22  Intake/Output from previous day: 12/04 0701 - 12/05 0700 In: 867 [P.O.:240; Blood:627] Out: 600 [Urine:600] Intake/Output this shift: No intake/output data recorded.  PE: Gen:  Alert, NAD, pleasant Pulm: Rate and effort normal Abd: Soft, mild distension, appropriately tender around laparoscopic incisions, no rigidity or guarding and otherwise NT, +BS. Incisions with glue intact appears well and are without drainage, bleeding, or signs of infection  Psych: A&Ox3   Lab Results:  Recent Labs    08/27/22 0341 08/27/22 1711 08/28/22 0351  WBC 4.2  --  8.5  HGB 6.6* 9.7* 9.0*  HCT 19.9* 27.7* 25.7*  PLT 86*  --  93*   BMET Recent Labs    08/27/22 0341 08/28/22 0351  NA 136 139  K 4.6 4.4  CL 105 108  CO2 17* 19*  GLUCOSE 297* 146*  BUN 76* 78*  CREATININE 3.52* 3.09*  CALCIUM 7.4* 8.5*   PT/INR No results for input(s): "LABPROT", "INR" in the last 72 hours. CMP     Component Value Date/Time   NA 139 08/28/2022 0351   K 4.4 08/28/2022 0351   CL 108 08/28/2022 0351   CO2 19 (L) 08/28/2022 0351   GLUCOSE 146 (H) 08/28/2022 0351   BUN 78 (H) 08/28/2022 0351   CREATININE 3.09 (H) 08/28/2022 0351   CALCIUM 8.5 (L) 08/28/2022 0351   PROT 5.3 (L) 08/28/2022 0351   ALBUMIN 2.4 (L) 08/28/2022 0351   AST 48 (H) 08/28/2022 0351   ALT 99 (H) 08/28/2022 0351   ALKPHOS 131 (H) 08/28/2022 0351   BILITOT 1.0 08/28/2022 0351   GFRNONAA 21 (L) 08/28/2022 0351   Lipase     Component Value Date/Time    LIPASE 32 08/24/2022 1812    Studies/Results: DG Cholangiogram Operative  Result Date: 08/26/2022 CLINICAL DATA:  History of choledocholithiasis status post ERCP. Patient now undergoing cholecystectomy. EXAM: INTRAOPERATIVE CHOLANGIOGRAM TECHNIQUE: Cholangiographic images from the C-arm fluoroscopic device were submitted for interpretation post-operatively. Please see the procedural report for the amount of contrast and the fluoroscopy time utilized. FLUOROSCOPY: Radiation Exposure Index (as provided by the fluoroscopic device): 3.14 mGy Kerma COMPARISON:  ERCP images 08/25/22 FINDINGS: Borderline nondiagnostic image due to significant over penetration. Contrast is visualized within the distal common bile duct and passing through the ampulla and into the duodenum. IMPRESSION: Borderline nondiagnostic single image due to significant over penetration. The biliary sphincter appears patent. Contrast material partially fills a structure adjacent to the ampulla which may represent a duodenal diverticulum. Electronically Signed   By: Jacqulynn Cadet M.D.   On: 08/26/2022 11:09   DG C-Arm 1-60 Min-No Report  Result Date: 08/26/2022 Fluoroscopy was utilized by the requesting physician.  No radiographic interpretation.    Anti-infectives: Anti-infectives (From admission, onward)    Start     Dose/Rate Route Frequency Ordered Stop   08/25/22 2200  cefTRIAXone (ROCEPHIN) 2 g in sodium chloride 0.9 % 100 mL IVPB        2 g 200 mL/hr over 30 Minutes Intravenous Every  24 hours 08/25/22 0006     08/25/22 1000  metroNIDAZOLE (FLAGYL) IVPB 500 mg  Status:  Discontinued        500 mg 100 mL/hr over 60 Minutes Intravenous Every 12 hours 08/25/22 0006 08/27/22 0917   08/24/22 1930  cefTRIAXone (ROCEPHIN) 2 g in sodium chloride 0.9 % 100 mL IVPB        2 g 200 mL/hr over 30 Minutes Intravenous  Once 08/24/22 1916 08/24/22 2055   08/24/22 1930  metroNIDAZOLE (FLAGYL) IVPB 500 mg        500 mg 100 mL/hr over 60  Minutes Intravenous  Once 08/24/22 1916 08/24/22 2202      ERCP Impression 12/2 - 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 - The major papilla appeared to be exuding mucin. - A filling defect consistent with a stone was seen on the cholangiogram. - Choledocholithiasis was found. Complete removal was accomplished by biliary sphincterotomy and balloon extraction. - A biliary sphincterotomy was performed. - The biliary tree was swept.   Assessment/Plan POD 2 s/p lap chole with IOC by Dr. Kieth Brightly - 08/26/22 - Underwent ERCP pre-op with Choledocholithiasis found and complete removal accomplished by biliary sphincterotomy and balloon extraction. - IOC intra-op findings: "CBD filled and emptied into the duodenum" - LFT's downtreding with T. Bili wnl - Multimodal pain control, well controlled with po options.  - Mobilize, pulm toilet - Defer abx for bacteremia and timing of discharge to [facility]. Will arrange f/u in the office and send pain meds to pharmacy. Discussed discharge instructions, restrictions and return/call back precautions. Please call back with any questions or concerns.    FEN - HH VTE - SCDs, was on hold for anemia. Defer timing of chem ppx initiation to primary team.  ID - Rocephin. Defer abx choice and duration to hospitalist for bactermia. Afebrile. WBC 8.5. No tachycardia or hypotension.  Foley - None, voiding.  Plan - Abx for bacteremia per [facility].    - Per [facility] -  E. Coli Bacteremia  Acute on chronic anemia - S/p 1U 12/2 and 2U 12/4. Hgb 9 this am and VSS/HDS. Notes he has followed with hematologist as outpatient previously. Thrombocytopenia CKD4 HTN HLD   LOS: 4 days    Jillyn Ledger , [facility] 08/28/2022, 8:03 AM Please see Amion for pager number during day hours 7:00am-4:30pm

## 2022-08-28 NOTE — Progress Notes (Signed)
Pt transported off unit via discharge RN. All belongings with pt and he remains alert and oriented at baseline. Discharged to care of wife.

## 2022-08-28 NOTE — Discharge Summary (Signed)
Physician Discharge Summary  RANI SISNEY [id number] DOB: [birth year] DOA: 08/24/2022  PCP: Wenda Low, MD  Admit date: 08/24/2022 Discharge date: 08/28/2022  Admitted From: home Disposition:  home  Recommendations for Outpatient Follow-up:  Follow up with PCP in 1-2 weeks Please obtain BMP/CBC in one week  Home Health:No Equipment/Devices:None  Discharge Condition:Stable CODE STATUS:Full Diet recommendation: Heart Healthy   Brief/Interim Summary: 71 y.o. male past medical history significant for type 2 diabetes mellitus, essential hypertension comes in for 4 days of abdominal pain worse after eating went to see his PCP LFTs were significantly elevated, was also having fevers sent to the ED, CT scan showed gallbladder wall thickening, gallbladder is and concern for acute cholecystitis was started on IV fluids IV antibiotics GI was consulted [facility] who recommended the patient be transferred to [facility] for possible ERCP, Carol Ada to see.   Discharge Diagnoses:  Principal Problem:   Acute cholangitis Active Problems:   Severe sepsis ([place])   Uncontrolled type 2 diabetes mellitus with hyperglycemia, without long-term current use of insulin (HCC)   Essential hypertension   Mixed hyperlipidemia   CKD (chronic kidney disease), stage IV (HCC)  Severe sepsis secondary to acute cholecystitis/acute cholangitis/E. coli bacteremia: He was noted IV antibiotics. GI was consulted for formal ERCP as abdominal ultrasound showed stone ERCP was done with sphincterotomy and stone removal. Surgery was consulted and status post lap chole on 08/26/2022. Blood cultures grew E. coli pansensitive and he will continue antibiotics for as an outpatient.  Normocytic anemia: Appears to be chronically status post 2 units packed red blood cells, he will follow-up with hematology as an outpatient.  Elevated LFTs: Likely due to above,  Diabetes mellitus type 2 uncontrolled with  hyperglycemia: No change made to his medication follow-up with PCP and titrate agents as needed.  Essential hypertension: All antihypertensive medications were held on admission he will resume them as an outpatient.  Hyperlipidemia: Noted.  Chronic disease stage IV: His creatinine remained at baseline.   Discharge Instructions  Discharge Instructions     Diet - low sodium heart healthy   Complete by: As directed    Increase activity slowly   Complete by: As directed    No wound care   Complete by: As directed       Allergies as of 08/28/2022   No Known Allergies      Medication List     TAKE these medications    allopurinol 100 MG tablet Commonly known as: ZYLOPRIM Take 100 mg by mouth daily.   amLODipine 5 MG tablet Commonly known as: NORVASC Take 5 mg by mouth daily.   amoxicillin-clavulanate 875-125 MG tablet Commonly known as: AUGMENTIN Take 1 tablet by mouth every 12 (twelve) hours for 7 days.   esomeprazole 20 MG capsule Commonly known as: NEXIUM Take 20 mg by mouth daily at 12 noon.   glimepiride 1 MG tablet Commonly known as: AMARYL Take 1 mg by mouth every morning.   Jardiance 25 MG Tabs tablet Generic drug: empagliflozin Take 25 mg by mouth daily.   lisinopril 40 MG tablet Commonly known as: ZESTRIL Take 40 mg by mouth daily.   metFORMIN 500 MG tablet Commonly known as: GLUCOPHAGE Take 500 mg by mouth at bedtime.   metoprolol succinate 25 MG 24 hr tablet Commonly known as: TOPROL-XL Take 25 mg by mouth every evening.   simvastatin 40 MG tablet Commonly known as: ZOCOR Take 40 mg by mouth at bedtime.   sodium bicarbonate  650 MG tablet Take 650 mg by mouth 3 (three) times daily.   tadalafil 20 MG tablet Commonly known as: CIALIS Take 20 mg by mouth daily as needed for erectile dysfunction.   traMADol 50 MG tablet Commonly known as: ULTRAM Take 1 tablet (50 mg total) by mouth every 6 (six) hours as needed (breakthrough  pain).        Follow-up [facility], [place]. Call.   Specialty: General Surgery Why: We are working on your appointment,call to confirm, Arrive 19mn early to check in, fill out paperwork, BEngineer, civil (consulting)ID and iDoctor, general practiceinformation: [address]S[phone]              No Known Allergies  Consultations: Gastroenterology General surgery   Procedures/Studies: DG Cholangiogram Operative  Result Date: 08/26/2022 CLINICAL DATA:  History of choledocholithiasis status post ERCP. Patient now undergoing cholecystectomy. EXAM: INTRAOPERATIVE CHOLANGIOGRAM TECHNIQUE: Cholangiographic images from the C-arm fluoroscopic device were submitted for interpretation post-operatively. Please see the procedural report for the amount of contrast and the fluoroscopy time utilized. FLUOROSCOPY: Radiation Exposure Index (as provided by the fluoroscopic device): 3.14 mGy Kerma COMPARISON:  ERCP images 08/25/22 FINDINGS: Borderline nondiagnostic image due to significant over penetration. Contrast is visualized within the distal common bile duct and passing through the ampulla and into the duodenum. IMPRESSION: Borderline nondiagnostic single image due to significant over penetration. The biliary sphincter appears patent. Contrast material partially fills a structure adjacent to the ampulla which may represent a duodenal diverticulum. Electronically Signed   By: HJacqulynn CadetM.D.   On: 08/26/2022 11:09   DG C-Arm 1-60 Min-No Report  Result Date: 08/26/2022 Fluoroscopy was utilized by the requesting physician.  No radiographic interpretation.   DG ERCP  Result Date: 08/25/2022 CLINICAL DATA:  71year old male with history of distal common bile duct obstruction. EXAM: ERCP TECHNIQUE: Multiple spot images obtained with the fluoroscopic device and submitted for interpretation post-procedure. FLUOROSCOPY  TIME:  338.76 mGy COMPARISON:  08/24/2022, 08/25/2022 FINDINGS: Endoscope is positioned in the second portion the duodenum. There is retrograde cannulation of the common bile duct. Retrograde cholangiogram demonstrates moderate extrahepatic biliary ductal dilation with rounded filling defect near the ampulla. Balloon sweep and sphincterotomy were performed with relative similar appearance of the common bile duct on final image. IMPRESSION: Distal common bile duct obstruction with intervention. These images were submitted for radiologic interpretation only. Please see the procedural report for the full procedural details, amount of contrast, and the fluoroscopy time utilized. DRuthann Cancer MD [facility] Electronically Signed   By: DRuthann CancerM.D.   On: 08/25/2022 13:51   UKoreaAbdomen Limited  Result Date: 08/25/2022 CLINICAL DATA:  71year old male with history of transaminitis and sepsis. EXAM: ULTRASOUND ABDOMEN LIMITED RIGHT UPPER QUADRANT COMPARISON:  CT abdomen pelvis from 08/24/2022 FINDINGS: Gallbladder: Diffuse thickening of gallbladder wall measuring up to 0.8 cm. No significant pericholecystic fluid. No significant gallbladder distension. Layering sludge in the infundibulum. No evidence of definite cholelithiasis. No sonographic Murphy sign. Common bile duct: Diameter: 1.1 cm. Liver: No focal lesion identified. Within normal limits in parenchymal echogenicity and echotexture. Portal vein is patent on color Doppler imaging with normal direction of blood flow towards the liver. Other: No perihepatic ascites. IMPRESSION: 1. Gallbladder wall thickening and layering sludge without evidence of significant distension or cholelithiasis. These findings are unlikely to represent acute cholecystitis. 2. Common bile duct  dilation without evidence of intrahepatic biliary ductal dilation, concerning for distal common bile duct obstructive process. 3. Normal  sonographic appearance of the hepatic parenchyma. Ruthann Cancer, MD [facility] Electronically Signed   By: Ruthann Cancer M.D.   On: 08/25/2022 09:25   CT ABDOMEN PELVIS WO CONTRAST  Addendum Date: 08/25/2022   ADDENDUM REPORT: 08/25/2022 07:33 ADDENDUM: Upon further review coronal image 46 of series 5 and axial image 38 of series 2 demonstrates a calcification at the expected level of the ampulla measuring 7 mm. This is associated with dilatation of the common bile duct estimated to measure up to 13 mm in the porta hepatis on coronal image 41 of series 5. These results were discussed by telephone at the time of interpretation on 08/25/2022 at 7:25 am with provider Dr. Benson Norway of GI, who verbally acknowledged these results. Electronically Signed   By: Vinnie Langton M.D.   On: 08/25/2022 07:33   Result Date: 08/25/2022 CLINICAL DATA:  Epigastric pain EXAM: CT ABDOMEN AND PELVIS WITHOUT CONTRAST TECHNIQUE: Multidetector CT imaging of the abdomen and pelvis was performed following the standard protocol without IV contrast. RADIATION DOSE REDUCTION: This exam was performed according to the departmental dose-optimization program which includes automated exposure control, adjustment of the mA and/or kV according to patient size and/or use of iterative reconstruction technique. COMPARISON:  None Available. FINDINGS: Lower chest: Scarring in the lung bases. No acute findings. Calcifications in the visualized coronary arteries. Hepatobiliary: Gallbladder wall is indistinct and thickened. Probable stones within the gallbladder. Appearance is concerning for acute cholecystitis. No focal hepatic abnormality. Pancreas: No focal abnormality or ductal dilatation. Spleen: No focal abnormality.  Normal size. Adrenals/Urinary Tract: No adrenal abnormality. No focal renal abnormality. No stones or hydronephrosis. Urinary bladder is unremarkable. Stomach/Bowel: Stomach,  large and small bowel grossly unremarkable. Normal appendix. Vascular/Lymphatic: Aortic atherosclerosis. No evidence of aneurysm or adenopathy. Reproductive: Radiation seeds in the region of the prostate. No visible focal abnormality. Other: No free fluid or free air. Musculoskeletal: No acute bony abnormality. IMPRESSION: Gallbladder wall appears thickened and indistinct. Suspect gallstones within the gallbladder. Findings are concerning for acute cholecystitis. This could be further evaluated with right upper quadrant ultrasound if clinically indicated. Coronary artery disease, aortic atherosclerosis. Electronically Signed: By: Rolm Baptise M.D. On: 08/24/2022 19:39   DG Chest Port 1 View  Result Date: 08/24/2022 CLINICAL DATA:  Sepsis, abnormal liver function tests EXAM: PORTABLE CHEST 1 VIEW COMPARISON:  None Available. FINDINGS: Single frontal view of the chest demonstrates an unremarkable cardiac silhouette. No airspace disease, effusion, or pneumothorax. No acute bony abnormalities. IMPRESSION: 1. No acute intrathoracic process. Electronically Signed   By: Randa Ngo M.D.   On: 08/24/2022 19:41   (Echo, Carotid, EGD, Colonoscopy, ERCP)    Subjective: No complaints  Discharge Exam: Vitals:   08/28/22 0505 08/28/22 0812  BP: 111/71 125/72  Pulse: (!) 53 (!) 56  Resp: 18 17  Temp: (!) 97.5 F (36.4 C) 97.8 F (36.6 C)  SpO2: 97% 98%   Vitals:   08/27/22 1834 08/27/22 2133 08/28/22 0505 08/28/22 0812  BP: 123/81 116/72 111/71 125/72  Pulse: 62 (!) 56 (!) 53 (!) 56  Resp: '17 16 18 17  '$ Temp: 97.9 F (36.6 C) 98 F (36.7 C) (!) 97.5 F (36.4 C) 97.8 F (36.6 C)  TempSrc: Oral Oral Oral Oral  SpO2: 95% 97% 97% 98%  Weight:      Height:  General: Pt is alert, awake, not in acute distress Cardiovascular: RRR, S1/S2 +, no rubs, no gallops Respiratory: CTA bilaterally, no wheezing, no rhonchi Abdominal: Soft, NT, ND, bowel sounds + Extremities: no edema, no  cyanosis    The results of significant diagnostics from this hospitalization (including imaging, microbiology, ancillary and laboratory) are listed below for reference.     Microbiology: Recent Results (from the past 240 hour(s))  Blood Culture (routine x 2)     Status: Abnormal   Collection Time: 08/24/22  8:00 PM   Specimen: Left Antecubital; Blood  Result Value Ref Range Status   Specimen Description   Final    LEFT ANTECUBITAL BLOOD Performed at [facility], [address] [place], [place] [address]    Special Requests   Final    Blood Culture adequate volume Performed at [facility], [address], [place], [place] [address]    Culture  Setup Time   Final    GRAM NEGATIVE RODS IN BOTH AEROBIC AND ANAEROBIC BOTTLES Gram Stain Report Called to,Read Back By and Verified With: HEMZE @ [id number] ON [id number] BY HENDERSON L RESULTS PREVIOULSLY CALLED. CRITICAL RESULT CALLED TO, READ BACK BY AND VERIFIED WITH:  Lars Masson, RN 08/25/22 2105 A. LAFRANCE Performed at [facility], [place] [address] [place], [place] [address]    Culture ESCHERICHIA COLI (A)  Final   Report Status 08/28/2022 FINAL  Final   Organism ID, Bacteria ESCHERICHIA COLI  Final      Susceptibility   Escherichia coli - MIC*    AMPICILLIN 8 SENSITIVE Sensitive     CEFAZOLIN <=4 SENSITIVE Sensitive     CEFEPIME <=0.12 SENSITIVE Sensitive     CEFTAZIDIME <=1 SENSITIVE Sensitive     CEFTRIAXONE <=0.25 SENSITIVE Sensitive     CIPROFLOXACIN <=0.25 SENSITIVE Sensitive     GENTAMICIN <=1 SENSITIVE Sensitive     IMIPENEM <=0.25 SENSITIVE Sensitive     TRIMETH/SULFA <=20 SENSITIVE Sensitive     AMPICILLIN/SULBACTAM <=2 SENSITIVE Sensitive     PIP/TAZO <=4 SENSITIVE Sensitive     * ESCHERICHIA COLI  Blood Culture ID Panel (Reflexed)     Status: Abnormal   Collection Time: 08/24/22  8:00 PM  Result Value Ref Range Status   Enterococcus faecalis NOT DETECTED NOT DETECTED Final   Enterococcus Faecium NOT  DETECTED NOT DETECTED Final   Listeria monocytogenes NOT DETECTED NOT DETECTED Final   Staphylococcus species NOT DETECTED NOT DETECTED Final   Staphylococcus aureus (BCID) NOT DETECTED NOT DETECTED Final   Staphylococcus epidermidis NOT DETECTED NOT DETECTED Final   Staphylococcus lugdunensis NOT DETECTED NOT DETECTED Final   Streptococcus species NOT DETECTED NOT DETECTED Final   Streptococcus agalactiae NOT DETECTED NOT DETECTED Final   Streptococcus pneumoniae NOT DETECTED NOT DETECTED Final   Streptococcus pyogenes NOT DETECTED NOT DETECTED Final   A.calcoaceticus-baumannii NOT DETECTED NOT DETECTED Final   Bacteroides fragilis NOT DETECTED NOT DETECTED Final   Enterobacterales DETECTED (A) NOT DETECTED Final    Comment: Enterobacterales represent a large order of gram negative bacteria, not a single organism. CRITICAL RESULT CALLED TO, READ BACK BY AND VERIFIED WITH:  Lars Masson, RN 08/25/22 2105 A. LAFRANCE    Enterobacter cloacae complex NOT DETECTED NOT DETECTED Final   Escherichia coli DETECTED (A) NOT DETECTED Final    Comment: CRITICAL RESULT CALLED TO, READ BACK BY AND VERIFIED WITH:  Lars Masson, RN 08/25/22 2105 A. LAFRANCE    Klebsiella aerogenes NOT DETECTED  NOT DETECTED Final   Klebsiella oxytoca NOT DETECTED NOT DETECTED Final   Klebsiella pneumoniae NOT DETECTED NOT DETECTED Final   Proteus species NOT DETECTED NOT DETECTED Final   Salmonella species NOT DETECTED NOT DETECTED Final   Serratia marcescens NOT DETECTED NOT DETECTED Final   Haemophilus influenzae NOT DETECTED NOT DETECTED Final   Neisseria meningitidis NOT DETECTED NOT DETECTED Final   Pseudomonas aeruginosa NOT DETECTED NOT DETECTED Final   Stenotrophomonas maltophilia NOT DETECTED NOT DETECTED Final   Candida albicans NOT DETECTED NOT DETECTED Final   Candida auris NOT DETECTED NOT DETECTED Final   Candida glabrata NOT DETECTED NOT DETECTED Final   Candida krusei NOT DETECTED NOT DETECTED  Final   Candida parapsilosis NOT DETECTED NOT DETECTED Final   Candida tropicalis NOT DETECTED NOT DETECTED Final   Cryptococcus neoformans/gattii NOT DETECTED NOT DETECTED Final   CTX-M ESBL NOT DETECTED NOT DETECTED Final   Carbapenem resistance IMP NOT DETECTED NOT DETECTED Final   Carbapenem resistance KPC NOT DETECTED NOT DETECTED Final   Carbapenem resistance NDM NOT DETECTED NOT DETECTED Final   Carbapenem resist OXA 48 LIKE NOT DETECTED NOT DETECTED Final   Carbapenem resistance VIM NOT DETECTED NOT DETECTED Final    Comment: Performed at [facility], [address] [place], [place] [address]  Blood Culture (routine x 2)     Status: Abnormal   Collection Time: 08/24/22  8:14 PM   Specimen: BLOOD LEFT HAND  Result Value Ref Range Status   Specimen Description   Final    BLOOD LEFT HAND BOTTLES DRAWN AEROBIC AND ANAEROBIC Performed at [facility], [address], [place], [place] [address]    Special Requests   Final    Blood Culture adequate volume Performed at [facility], [address] [place], [place] [address]    Culture  Setup Time   Final    GRAM NEGATIVE RODS ANAEROBIC BOTTLE ONLY Gram Stain Report Called to,Read Back By and Verified With: HEMZE @ [id number] BY HENDERSON L CRITICAL VALUE NOTED.  VALUE IS CONSISTENT WITH PREVIOUSLY REPORTED AND CALLED VALUE.    Culture (A)  Final    ESCHERICHIA COLI SUSCEPTIBILITIES PERFORMED ON PREVIOUS CULTURE WITHIN THE LAST 5 DAYS. Performed at [facility], [place] [address], [place], [place] [address]    Report Status 08/28/2022 FINAL  Final  Resp Panel by RT-PCR (Flu A&B, Covid) Anterior Nasal Swab     Status: None   Collection Time: 08/24/22  8:53 PM   Specimen: Anterior Nasal Swab  Result Value Ref Range Status   SARS Coronavirus 2 by RT PCR NEGATIVE NEGATIVE Final    Comment: (NOTE) SARS-CoV-2 target nucleic acids are NOT DETECTED.  The SARS-CoV-2 RNA is generally detectable in upper  respiratory specimens during the acute phase of infection. The lowest concentration of SARS-CoV-2 viral copies this assay can detect is 138 copies/mL. A negative result does not preclude SARS-Cov-2 infection and should not be used as the sole basis for treatment or other patient management decisions. A negative result may occur with  improper specimen collection/handling, submission of specimen other than nasopharyngeal swab, presence of viral mutation(s) within the areas targeted by this assay, and inadequate number of viral copies(<138 copies/mL). A negative result must be combined with clinical observations, patient history, and epidemiological information. The expected result is Negative.  Fact Sheet for Patients:  EntrepreneurPulse.com.au  Fact Sheet for Healthcare Providers:  IncredibleEmployment.be  This test is no t yet approved or  cleared by the [place] and  has been authorized for detection and/or diagnosis of SARS-CoV-2 by FDA under an Emergency Use Authorization (EUA). This EUA will remain  in effect (meaning this test can be used) for the duration of the COVID-19 declaration under Section 564(b)(1) of the Act, 21 U.S.C.section 360bbb-3(b)(1), unless the authorization is terminated  or revoked sooner.       Influenza A by PCR NEGATIVE NEGATIVE Final   Influenza B by PCR NEGATIVE NEGATIVE Final    Comment: (NOTE) The Xpert Xpress SARS-CoV-2/FLU/RSV plus assay is intended as an aid in the diagnosis of influenza from Nasopharyngeal swab specimens and should not be used as a sole basis for treatment. Nasal washings and aspirates are unacceptable for Xpert Xpress SARS-CoV-2/FLU/RSV testing.  Fact Sheet for Patients: EntrepreneurPulse.com.au  Fact Sheet for Healthcare Providers: IncredibleEmployment.be  This test is not yet approved or cleared by the [place] FDA and has been  authorized for detection and/or diagnosis of SARS-CoV-2 by FDA under an Emergency Use Authorization (EUA). This EUA will remain in effect (meaning this test can be used) for the duration of the COVID-19 declaration under Section 564(b)(1) of the Act, 21 U.S.C. section 360bbb-3(b)(1), unless the authorization is terminated or revoked.  Performed at [facility], [address], [place], [place] [address]   Urine Culture     Status: Abnormal   Collection Time: 08/24/22  8:53 PM   Specimen: In/Out Cath Urine  Result Value Ref Range Status   Specimen Description   Final    IN/OUT CATH URINE Performed at [facility], [address], [place], [place] [address]    Special Requests   Final    NONE Performed at [facility], [address] [place], [place] [address]    Culture MULTIPLE SPECIES PRESENT, SUGGEST RECOLLECTION (A)  Final   Report Status 08/26/2022 FINAL  Final     Labs: BNP (last 3 results) No results for input(s): "BNP" in the last 8760 hours. Basic Metabolic Panel: Recent Labs  Lab 08/24/22 1812 08/25/22 0208 08/27/22 0341 08/28/22 0351  NA 136 139 136 139  K 3.7 3.9 4.6 4.4  CL 101 104 105 108  CO2 19* 21* 17* 19*  GLUCOSE 211* 190* 297* 146*  BUN 45* 52* 76* 78*  CREATININE 3.18* 3.27* 3.52* 3.09*  CALCIUM 8.0* 7.9* 7.4* 8.5*  MG  --  1.3*  --   --   PHOS  --  5.7*  --   --    Liver Function Tests: Recent Labs  Lab 08/24/22 1812 08/25/22 0208 08/27/22 0341 08/28/22 0351  AST 512* 527* 94* 48*  ALT 269* 232* 134* 99*  ALKPHOS 259* 197* 137* 131*  BILITOT 5.6* 4.4* 1.8* 1.0  PROT 7.0 5.6* 5.3* 5.3*  ALBUMIN 3.3* 2.5* 2.5* 2.4*   Recent Labs  Lab 08/24/22 1812  LIPASE 32   No results for input(s): "AMMONIA" in the last 168 hours. CBC: Recent Labs  Lab 08/24/22 1812 08/25/22 0208 08/25/22 1831 08/27/22 0341 08/27/22 1711 08/28/22 0351  WBC 17.1* 11.1* 8.5 4.2  --  8.5  NEUTROABS 16.5*  --   --   --   --   --   HGB 8.3* 7.0* 7.9* 6.6*  9.7* 9.0*  HCT 24.3* 19.3* 22.6* 19.9* 27.7* 25.7*  MCV 98.4 95.5 95.0 97.5  --  91.8  PLT 159 103* 80* 86*  --  93*   Cardiac Enzymes: No results for input(s): "CKTOTAL", "CKMB", "CKMBINDEX", "TROPONINI" in  the last 168 hours. BNP: Invalid input(s): "POCBNP" CBG: Recent Labs  Lab 08/27/22 0748 08/27/22 1110 08/27/22 1720 08/27/22 2136 08/28/22 0808  GLUCAP 274* 308* 226* 182* 223*   D-Dimer No results for input(s): "DDIMER" in the last 72 hours. Hgb A1c No results for input(s): "HGBA1C" in the last 72 hours. Lipid Profile No results for input(s): "CHOL", "HDL", "LDLCALC", "TRIG", "CHOLHDL", "LDLDIRECT" in the last 72 hours. Thyroid function studies No results for input(s): "TSH", "T4TOTAL", "T3FREE", "THYROIDAB" in the last 72 hours.  Invalid input(s): "FREET3" Anemia work up No results for input(s): "VITAMINB12", "FOLATE", "FERRITIN", "TIBC", "IRON", "RETICCTPCT" in the last 72 hours. Urinalysis    Component Value Date/Time   COLORURINE AMBER (A) 08/24/2022 2053   APPEARANCEUR HAZY (A) 08/24/2022 2053   LABSPEC 1.022 08/24/2022 2053   PHURINE 5.0 08/24/2022 2053   GLUCOSEU >=500 (A) 08/24/2022 2053   HGBUR LARGE (A) 08/24/2022 2053   BILIRUBINUR NEGATIVE 08/24/2022 2053   KETONESUR 5 (A) 08/24/2022 2053   PROTEINUR >=300 (A) 08/24/2022 2053   NITRITE NEGATIVE 08/24/2022 2053   LEUKOCYTESUR NEGATIVE 08/24/2022 2053   Sepsis Labs Recent Labs  Lab 08/25/22 0208 08/25/22 1831 08/27/22 0341 08/28/22 0351  WBC 11.1* 8.5 4.2 8.5   Microbiology Recent Results (from the past 240 hour(s))  Blood Culture (routine x 2)     Status: Abnormal   Collection Time: 08/24/22  8:00 PM   Specimen: Left Antecubital; Blood  Result Value Ref Range Status   Specimen Description   Final    LEFT ANTECUBITAL BLOOD Performed at [facility], [place] [address], [place], [place] [address]    Special Requests   Final    Blood Culture adequate volume Performed at [facility], [address], [place], [place] [address]    Culture  Setup Time   Final    GRAM NEGATIVE RODS IN BOTH AEROBIC AND ANAEROBIC BOTTLES Gram Stain Report Called to,Read Back By and Verified With: HEMZE @ [id number] ON [id number] BY HENDERSON L RESULTS PREVIOULSLY CALLED. CRITICAL RESULT CALLED TO, READ BACK BY AND VERIFIED WITH:  Lars Masson, RN 08/25/22 2105 A. LAFRANCE Performed at [facility], [place] [address], [place], [place] [address]    Culture ESCHERICHIA COLI (A)  Final   Report Status 08/28/2022 FINAL  Final   Organism ID, Bacteria ESCHERICHIA COLI  Final      Susceptibility   Escherichia coli - MIC*    AMPICILLIN 8 SENSITIVE Sensitive     CEFAZOLIN <=4 SENSITIVE Sensitive     CEFEPIME <=0.12 SENSITIVE Sensitive     CEFTAZIDIME <=1 SENSITIVE Sensitive     CEFTRIAXONE <=0.25 SENSITIVE Sensitive     CIPROFLOXACIN <=0.25 SENSITIVE Sensitive     GENTAMICIN <=1 SENSITIVE Sensitive     IMIPENEM <=0.25 SENSITIVE Sensitive     TRIMETH/SULFA <=20 SENSITIVE Sensitive     AMPICILLIN/SULBACTAM <=2 SENSITIVE Sensitive     PIP/TAZO <=4 SENSITIVE Sensitive     * ESCHERICHIA COLI  Blood Culture ID Panel (Reflexed)     Status: Abnormal   Collection Time: 08/24/22  8:00 PM  Result Value Ref Range Status   Enterococcus faecalis NOT DETECTED NOT DETECTED Final   Enterococcus Faecium NOT DETECTED NOT DETECTED Final   Listeria monocytogenes NOT DETECTED NOT DETECTED Final   Staphylococcus species NOT DETECTED NOT DETECTED Final   Staphylococcus aureus (BCID) NOT DETECTED NOT DETECTED Final   Staphylococcus epidermidis NOT DETECTED NOT DETECTED Final   Staphylococcus lugdunensis NOT DETECTED  NOT DETECTED Final   Streptococcus species NOT DETECTED NOT DETECTED Final   Streptococcus agalactiae NOT DETECTED NOT DETECTED Final   Streptococcus pneumoniae NOT DETECTED NOT DETECTED Final   Streptococcus pyogenes NOT DETECTED NOT DETECTED Final   A.calcoaceticus-baumannii NOT DETECTED NOT DETECTED  Final   Bacteroides fragilis NOT DETECTED NOT DETECTED Final   Enterobacterales DETECTED (A) NOT DETECTED Final    Comment: Enterobacterales represent a large order of gram negative bacteria, not a single organism. CRITICAL RESULT CALLED TO, READ BACK BY AND VERIFIED WITH:  Lars Masson, RN 08/25/22 2105 A. LAFRANCE    Enterobacter cloacae complex NOT DETECTED NOT DETECTED Final   Escherichia coli DETECTED (A) NOT DETECTED Final    Comment: CRITICAL RESULT CALLED TO, READ BACK BY AND VERIFIED WITH:  Lars Masson, RN 08/25/22 2105 A. LAFRANCE    Klebsiella aerogenes NOT DETECTED NOT DETECTED Final   Klebsiella oxytoca NOT DETECTED NOT DETECTED Final   Klebsiella pneumoniae NOT DETECTED NOT DETECTED Final   Proteus species NOT DETECTED NOT DETECTED Final   Salmonella species NOT DETECTED NOT DETECTED Final   Serratia marcescens NOT DETECTED NOT DETECTED Final   Haemophilus influenzae NOT DETECTED NOT DETECTED Final   Neisseria meningitidis NOT DETECTED NOT DETECTED Final   Pseudomonas aeruginosa NOT DETECTED NOT DETECTED Final   Stenotrophomonas maltophilia NOT DETECTED NOT DETECTED Final   Candida albicans NOT DETECTED NOT DETECTED Final   Candida auris NOT DETECTED NOT DETECTED Final   Candida glabrata NOT DETECTED NOT DETECTED Final   Candida krusei NOT DETECTED NOT DETECTED Final   Candida parapsilosis NOT DETECTED NOT DETECTED Final   Candida tropicalis NOT DETECTED NOT DETECTED Final   Cryptococcus neoformans/gattii NOT DETECTED NOT DETECTED Final   CTX-M ESBL NOT DETECTED NOT DETECTED Final   Carbapenem resistance IMP NOT DETECTED NOT DETECTED Final   Carbapenem resistance KPC NOT DETECTED NOT DETECTED Final   Carbapenem resistance NDM NOT DETECTED NOT DETECTED Final   Carbapenem resist OXA 48 LIKE NOT DETECTED NOT DETECTED Final   Carbapenem resistance VIM NOT DETECTED NOT DETECTED Final    Comment: Performed at [facility], [address] [place], [place]  [address]  Blood Culture (routine x 2)     Status: Abnormal   Collection Time: 08/24/22  8:14 PM   Specimen: BLOOD LEFT HAND  Result Value Ref Range Status   Specimen Description   Final    BLOOD LEFT HAND BOTTLES DRAWN AEROBIC AND ANAEROBIC Performed at [facility], [address] [place], [place] [address]    Special Requests   Final    Blood Culture adequate volume Performed at [facility], [address], [place], [place] [address]    Culture  Setup Time   Final    GRAM NEGATIVE RODS ANAEROBIC BOTTLE ONLY Gram Stain Report Called to,Read Back By and Verified With: HEMZE @ [phone] BY HENDERSON L CRITICAL VALUE NOTED.  VALUE IS CONSISTENT WITH PREVIOUSLY REPORTED AND CALLED VALUE.    Culture (A)  Final    ESCHERICHIA COLI SUSCEPTIBILITIES PERFORMED ON PREVIOUS CULTURE WITHIN THE LAST 5 DAYS. Performed at [facility], [place] [address] [place],  [address]    Report Status 08/28/2022 FINAL  Final  Resp Panel by RT-PCR (Flu A&B, Covid) Anterior Nasal Swab     Status: None   Collection Time: 08/24/22  8:53 PM   Specimen: Anterior Nasal Swab  Result Value Ref Range Status   SARS Coronavirus 2 by  RT PCR NEGATIVE NEGATIVE Final    Comment: (NOTE) SARS-CoV-2 target nucleic acids are NOT DETECTED.  The SARS-CoV-2 RNA is generally detectable in upper respiratory specimens during the acute phase of infection. The lowest concentration of SARS-CoV-2 viral copies this assay can detect is 138 copies/mL. A negative result does not preclude SARS-Cov-2 infection and should not be used as the sole basis for treatment or other patient management decisions. A negative result may occur with  improper specimen collection/handling, submission of specimen other than nasopharyngeal swab, presence of viral mutation(s) within the areas targeted by this assay, and inadequate number of viral copies(<138 copies/mL). A negative result must be combined with clinical observations, patient  history, and epidemiological information. The expected result is Negative.  Fact Sheet for Patients:  EntrepreneurPulse.com.au  Fact Sheet for Healthcare Providers:  IncredibleEmployment.be  This test is no t yet approved or cleared by the [place] FDA and  has been authorized for detection and/or diagnosis of SARS-CoV-2 by FDA under an Emergency Use Authorization (EUA). This EUA will remain  in effect (meaning this test can be used) for the duration of the COVID-19 declaration under Section 564(b)(1) of the Act, 21 U.S.C.section 360bbb-3(b)(1), unless the authorization is terminated  or revoked sooner.       Influenza A by PCR NEGATIVE NEGATIVE Final   Influenza B by PCR NEGATIVE NEGATIVE Final    Comment: (NOTE) The Xpert Xpress SARS-CoV-2/FLU/RSV plus assay is intended as an aid in the diagnosis of influenza from Nasopharyngeal swab specimens and should not be used as a sole basis for treatment. Nasal washings and aspirates are unacceptable for Xpert Xpress SARS-CoV-2/FLU/RSV testing.  Fact Sheet for Patients: EntrepreneurPulse.com.au  Fact Sheet for Healthcare Providers: IncredibleEmployment.be  This test is not yet approved or cleared by the [place] FDA and has been authorized for detection and/or diagnosis of SARS-CoV-2 by FDA under an Emergency Use Authorization (EUA). This EUA will remain in effect (meaning this test can be used) for the duration of the COVID-19 declaration under Section 564(b)(1) of the Act, 21 U.S.C. section 360bbb-3(b)(1), unless the authorization is terminated or revoked.  Performed at [facility], [address], [place], [place] [address]   Urine Culture     Status: Abnormal   Collection Time: 08/24/22  8:53 PM   Specimen: In/Out Cath Urine  Result Value Ref Range Status   Specimen Description   Final    IN/OUT CATH URINE Performed at [facility],  [address] [place], [place] [address]    Special Requests   Final    NONE Performed at [facility], [address] [place], [place] [address]    Culture MULTIPLE SPECIES PRESENT, SUGGEST RECOLLECTION (A)  Final   Report Status 08/26/2022 FINAL  Final    SIGNED:   Charlynne Cousins, MD  [facility] 08/28/2022, 9:25 AM Pager   If 7PM-7AM, please contact night-coverage [email or web address] Password [email or web address]

## 2022-08-28 NOTE — Progress Notes (Signed)
Patient and wife given discharge instructions and verbalized understanding. PIV was already removed and patient dressed himself. TOC medications sent with patient along with all belongings. Patient discharged via wheelchair with wife.

## 2022-08-31 DIAGNOSIS — N184 Chronic kidney disease, stage 4 (severe): Secondary | ICD-10-CM | POA: Diagnosis not present

## 2022-08-31 DIAGNOSIS — E1122 Type 2 diabetes mellitus with diabetic chronic kidney disease: Secondary | ICD-10-CM | POA: Diagnosis not present

## 2022-08-31 DIAGNOSIS — N179 Acute kidney failure, unspecified: Secondary | ICD-10-CM | POA: Diagnosis not present

## 2022-08-31 DIAGNOSIS — I10 Essential (primary) hypertension: Secondary | ICD-10-CM | POA: Diagnosis not present

## 2022-08-31 DIAGNOSIS — Z9049 Acquired absence of other specified parts of digestive tract: Secondary | ICD-10-CM | POA: Diagnosis not present

## 2022-09-03 ENCOUNTER — Other Ambulatory Visit: Payer: Self-pay | Admitting: Hematology and Oncology

## 2022-09-03 DIAGNOSIS — D696 Thrombocytopenia, unspecified: Secondary | ICD-10-CM

## 2022-09-12 DIAGNOSIS — D61818 Other pancytopenia: Secondary | ICD-10-CM | POA: Diagnosis not present

## 2022-09-12 DIAGNOSIS — D631 Anemia in chronic kidney disease: Secondary | ICD-10-CM | POA: Diagnosis not present

## 2022-09-12 DIAGNOSIS — N184 Chronic kidney disease, stage 4 (severe): Secondary | ICD-10-CM | POA: Diagnosis not present

## 2022-09-25 ENCOUNTER — Telehealth: Payer: Self-pay | Admitting: Hematology and Oncology

## 2022-09-25 NOTE — Telephone Encounter (Signed)
Cancelled appointments per 1/2 scheduling message. Left voicemail.

## 2022-09-25 NOTE — Telephone Encounter (Signed)
Called patient to confirm cancellation of appointments. Patient notified.

## 2022-09-27 ENCOUNTER — Other Ambulatory Visit: Payer: Medicare Other

## 2022-09-27 ENCOUNTER — Ambulatory Visit: Payer: Medicare Other | Admitting: Hematology and Oncology

## 2022-10-11 LAB — HEMOGLOBIN A1C

## 2022-11-08 DIAGNOSIS — N1832 Chronic kidney disease, stage 3b: Secondary | ICD-10-CM | POA: Diagnosis not present

## 2022-11-13 DIAGNOSIS — D631 Anemia in chronic kidney disease: Secondary | ICD-10-CM | POA: Diagnosis not present

## 2022-11-13 DIAGNOSIS — R809 Proteinuria, unspecified: Secondary | ICD-10-CM | POA: Diagnosis not present

## 2022-11-13 DIAGNOSIS — N1832 Chronic kidney disease, stage 3b: Secondary | ICD-10-CM | POA: Diagnosis not present

## 2022-11-13 DIAGNOSIS — E872 Acidosis, unspecified: Secondary | ICD-10-CM | POA: Diagnosis not present

## 2022-11-13 DIAGNOSIS — I129 Hypertensive chronic kidney disease with stage 1 through stage 4 chronic kidney disease, or unspecified chronic kidney disease: Secondary | ICD-10-CM | POA: Diagnosis not present

## 2022-11-13 DIAGNOSIS — N2581 Secondary hyperparathyroidism of renal origin: Secondary | ICD-10-CM | POA: Diagnosis not present

## 2023-01-04 ENCOUNTER — Other Ambulatory Visit: Payer: Self-pay

## 2023-01-04 ENCOUNTER — Emergency Department (HOSPITAL_COMMUNITY)
Admission: EM | Admit: 2023-01-04 | Discharge: 2023-01-04 | Disposition: A | Discharge status: Home / Self Care | Payer: Medicare Other | Attending: Emergency Medicine | Admitting: Emergency Medicine

## 2023-01-04 ENCOUNTER — Encounter (HOSPITAL_COMMUNITY): Payer: Self-pay

## 2023-01-04 DIAGNOSIS — Y92410 Unspecified street and highway as the place of occurrence of the external cause: Secondary | ICD-10-CM | POA: Diagnosis not present

## 2023-01-04 DIAGNOSIS — R6889 Other general symptoms and signs: Secondary | ICD-10-CM | POA: Diagnosis not present

## 2023-01-04 DIAGNOSIS — S61409A Unspecified open wound of unspecified hand, initial encounter: Secondary | ICD-10-CM | POA: Diagnosis not present

## 2023-01-04 DIAGNOSIS — Z79899 Other long term (current) drug therapy: Secondary | ICD-10-CM | POA: Insufficient documentation

## 2023-01-04 DIAGNOSIS — E119 Type 2 diabetes mellitus without complications: Secondary | ICD-10-CM | POA: Insufficient documentation

## 2023-01-04 DIAGNOSIS — N189 Chronic kidney disease, unspecified: Secondary | ICD-10-CM | POA: Insufficient documentation

## 2023-01-04 DIAGNOSIS — S61512A Laceration without foreign body of left wrist, initial encounter: Secondary | ICD-10-CM | POA: Insufficient documentation

## 2023-01-04 DIAGNOSIS — Z7984 Long term (current) use of oral hypoglycemic drugs: Secondary | ICD-10-CM | POA: Insufficient documentation

## 2023-01-04 DIAGNOSIS — S6992XA Unspecified injury of left wrist, hand and finger(s), initial encounter: Secondary | ICD-10-CM | POA: Diagnosis present

## 2023-01-04 DIAGNOSIS — I129 Hypertensive chronic kidney disease with stage 1 through stage 4 chronic kidney disease, or unspecified chronic kidney disease: Secondary | ICD-10-CM | POA: Insufficient documentation

## 2023-01-04 DIAGNOSIS — Z743 Need for continuous supervision: Secondary | ICD-10-CM | POA: Diagnosis not present

## 2023-01-04 DIAGNOSIS — S51812A Laceration without foreign body of left forearm, initial encounter: Secondary | ICD-10-CM | POA: Insufficient documentation

## 2023-01-04 NOTE — Discharge Instructions (Addendum)
You were evaluated today for a skin tear of the left wrist.  Please keep the wound clean.  It will heal on its own over time.  Follow-up as needed with your primary care provider.
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
## 2023-01-04 NOTE — ED Notes (Signed)
Hands cleanse with soap and water.  Left hand abrasion/skin tear cleanse with wound cleanser and covered with telfa and wrapped with kling.  Pt understands wound care when he gets home
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
## 2023-01-15 DIAGNOSIS — N2581 Secondary hyperparathyroidism of renal origin: Secondary | ICD-10-CM | POA: Diagnosis not present

## 2023-01-15 DIAGNOSIS — N184 Chronic kidney disease, stage 4 (severe): Secondary | ICD-10-CM | POA: Diagnosis not present

## 2023-01-15 DIAGNOSIS — E1165 Type 2 diabetes mellitus with hyperglycemia: Secondary | ICD-10-CM | POA: Diagnosis not present

## 2023-01-15 DIAGNOSIS — I10 Essential (primary) hypertension: Secondary | ICD-10-CM | POA: Diagnosis not present

## 2023-01-15 DIAGNOSIS — K219 Gastro-esophageal reflux disease without esophagitis: Secondary | ICD-10-CM | POA: Diagnosis not present

## 2023-01-15 DIAGNOSIS — E1122 Type 2 diabetes mellitus with diabetic chronic kidney disease: Secondary | ICD-10-CM | POA: Diagnosis not present

## 2023-01-15 DIAGNOSIS — E782 Mixed hyperlipidemia: Secondary | ICD-10-CM | POA: Diagnosis not present

## 2023-01-15 DIAGNOSIS — Z85810 Personal history of malignant neoplasm of tongue: Secondary | ICD-10-CM | POA: Diagnosis not present

## 2023-01-15 DIAGNOSIS — Z0000 Encounter for general adult medical examination without abnormal findings: Secondary | ICD-10-CM | POA: Diagnosis not present

## 2023-01-15 DIAGNOSIS — I2510 Atherosclerotic heart disease of native coronary artery without angina pectoris: Secondary | ICD-10-CM | POA: Diagnosis not present

## 2023-01-15 DIAGNOSIS — I700 Atherosclerosis of aorta: Secondary | ICD-10-CM | POA: Diagnosis not present

## 2023-02-28 DIAGNOSIS — E1122 Type 2 diabetes mellitus with diabetic chronic kidney disease: Secondary | ICD-10-CM | POA: Diagnosis not present

## 2023-02-28 DIAGNOSIS — E782 Mixed hyperlipidemia: Secondary | ICD-10-CM | POA: Diagnosis not present

## 2023-02-28 DIAGNOSIS — I10 Essential (primary) hypertension: Secondary | ICD-10-CM | POA: Diagnosis not present

## 2023-02-28 DIAGNOSIS — N184 Chronic kidney disease, stage 4 (severe): Secondary | ICD-10-CM | POA: Diagnosis not present

## 2023-03-04 DIAGNOSIS — N1832 Chronic kidney disease, stage 3b: Secondary | ICD-10-CM | POA: Diagnosis not present

## 2023-03-06 DIAGNOSIS — E872 Acidosis, unspecified: Secondary | ICD-10-CM | POA: Diagnosis not present

## 2023-03-06 DIAGNOSIS — N1832 Chronic kidney disease, stage 3b: Secondary | ICD-10-CM | POA: Diagnosis not present

## 2023-03-06 DIAGNOSIS — D631 Anemia in chronic kidney disease: Secondary | ICD-10-CM | POA: Diagnosis not present

## 2023-03-06 DIAGNOSIS — R809 Proteinuria, unspecified: Secondary | ICD-10-CM | POA: Diagnosis not present

## 2023-03-06 DIAGNOSIS — N2581 Secondary hyperparathyroidism of renal origin: Secondary | ICD-10-CM | POA: Diagnosis not present

## 2023-03-06 DIAGNOSIS — I129 Hypertensive chronic kidney disease with stage 1 through stage 4 chronic kidney disease, or unspecified chronic kidney disease: Secondary | ICD-10-CM | POA: Diagnosis not present

## 2023-03-13 DIAGNOSIS — R531 Weakness: Secondary | ICD-10-CM | POA: Diagnosis not present

## 2023-03-13 DIAGNOSIS — D631 Anemia in chronic kidney disease: Secondary | ICD-10-CM | POA: Diagnosis not present

## 2023-03-13 DIAGNOSIS — D7589 Other specified diseases of blood and blood-forming organs: Secondary | ICD-10-CM | POA: Diagnosis not present

## 2023-03-13 DIAGNOSIS — D759 Disease of blood and blood-forming organs, unspecified: Secondary | ICD-10-CM | POA: Diagnosis not present

## 2023-03-13 DIAGNOSIS — R5383 Other fatigue: Secondary | ICD-10-CM | POA: Diagnosis not present

## 2023-03-13 DIAGNOSIS — N184 Chronic kidney disease, stage 4 (severe): Secondary | ICD-10-CM | POA: Diagnosis not present

## 2023-03-14 DIAGNOSIS — D6189 Other specified aplastic anemias and other bone marrow failure syndromes: Secondary | ICD-10-CM | POA: Diagnosis not present

## 2023-03-15 DIAGNOSIS — N184 Chronic kidney disease, stage 4 (severe): Secondary | ICD-10-CM | POA: Diagnosis not present

## 2023-03-15 DIAGNOSIS — D631 Anemia in chronic kidney disease: Secondary | ICD-10-CM | POA: Diagnosis not present

## 2023-03-15 DIAGNOSIS — D7589 Other specified diseases of blood and blood-forming organs: Secondary | ICD-10-CM | POA: Diagnosis not present

## 2023-03-15 DIAGNOSIS — D759 Disease of blood and blood-forming organs, unspecified: Secondary | ICD-10-CM | POA: Diagnosis not present

## 2023-03-21 DIAGNOSIS — D759 Disease of blood and blood-forming organs, unspecified: Secondary | ICD-10-CM | POA: Diagnosis not present

## 2023-03-21 DIAGNOSIS — D469 Myelodysplastic syndrome, unspecified: Secondary | ICD-10-CM | POA: Diagnosis not present

## 2023-03-24 DIAGNOSIS — D631 Anemia in chronic kidney disease: Secondary | ICD-10-CM | POA: Diagnosis not present

## 2023-03-24 DIAGNOSIS — D759 Disease of blood and blood-forming organs, unspecified: Secondary | ICD-10-CM | POA: Diagnosis not present

## 2023-03-24 DIAGNOSIS — N184 Chronic kidney disease, stage 4 (severe): Secondary | ICD-10-CM | POA: Diagnosis not present

## 2023-03-25 DIAGNOSIS — D469 Myelodysplastic syndrome, unspecified: Secondary | ICD-10-CM | POA: Diagnosis not present

## 2023-04-05 DIAGNOSIS — D469 Myelodysplastic syndrome, unspecified: Secondary | ICD-10-CM | POA: Diagnosis not present

## 2023-04-08 DIAGNOSIS — D469 Myelodysplastic syndrome, unspecified: Secondary | ICD-10-CM | POA: Diagnosis not present

## 2023-04-15 DIAGNOSIS — H524 Presbyopia: Secondary | ICD-10-CM | POA: Diagnosis not present

## 2023-04-15 DIAGNOSIS — H2513 Age-related nuclear cataract, bilateral: Secondary | ICD-10-CM | POA: Diagnosis not present

## 2023-04-18 DIAGNOSIS — N2581 Secondary hyperparathyroidism of renal origin: Secondary | ICD-10-CM | POA: Diagnosis not present

## 2023-04-18 DIAGNOSIS — N184 Chronic kidney disease, stage 4 (severe): Secondary | ICD-10-CM | POA: Diagnosis not present

## 2023-04-18 DIAGNOSIS — I10 Essential (primary) hypertension: Secondary | ICD-10-CM | POA: Diagnosis not present

## 2023-04-18 DIAGNOSIS — E1122 Type 2 diabetes mellitus with diabetic chronic kidney disease: Secondary | ICD-10-CM | POA: Diagnosis not present

## 2023-04-22 DIAGNOSIS — D469 Myelodysplastic syndrome, unspecified: Secondary | ICD-10-CM | POA: Diagnosis not present

## 2023-04-29 DIAGNOSIS — D469 Myelodysplastic syndrome, unspecified: Secondary | ICD-10-CM | POA: Diagnosis not present

## 2023-05-03 DIAGNOSIS — N1832 Chronic kidney disease, stage 3b: Secondary | ICD-10-CM | POA: Diagnosis not present

## 2023-05-08 DIAGNOSIS — N184 Chronic kidney disease, stage 4 (severe): Secondary | ICD-10-CM | POA: Diagnosis not present

## 2023-05-08 DIAGNOSIS — R809 Proteinuria, unspecified: Secondary | ICD-10-CM | POA: Diagnosis not present

## 2023-05-08 DIAGNOSIS — D631 Anemia in chronic kidney disease: Secondary | ICD-10-CM | POA: Diagnosis not present

## 2023-05-08 DIAGNOSIS — E872 Acidosis, unspecified: Secondary | ICD-10-CM | POA: Diagnosis not present

## 2023-05-08 DIAGNOSIS — N2581 Secondary hyperparathyroidism of renal origin: Secondary | ICD-10-CM | POA: Diagnosis not present

## 2023-05-08 DIAGNOSIS — I129 Hypertensive chronic kidney disease with stage 1 through stage 4 chronic kidney disease, or unspecified chronic kidney disease: Secondary | ICD-10-CM | POA: Diagnosis not present

## 2023-05-15 DIAGNOSIS — D469 Myelodysplastic syndrome, unspecified: Secondary | ICD-10-CM | POA: Diagnosis not present

## 2023-05-20 DIAGNOSIS — D469 Myelodysplastic syndrome, unspecified: Secondary | ICD-10-CM | POA: Diagnosis not present

## 2023-06-03 DIAGNOSIS — D469 Myelodysplastic syndrome, unspecified: Secondary | ICD-10-CM | POA: Diagnosis not present

## 2023-06-10 DIAGNOSIS — D469 Myelodysplastic syndrome, unspecified: Secondary | ICD-10-CM | POA: Diagnosis not present

## 2023-07-02 DIAGNOSIS — D469 Myelodysplastic syndrome, unspecified: Secondary | ICD-10-CM | POA: Diagnosis not present

## 2023-07-08 DIAGNOSIS — D469 Myelodysplastic syndrome, unspecified: Secondary | ICD-10-CM | POA: Diagnosis not present

## 2023-07-26 DIAGNOSIS — D469 Myelodysplastic syndrome, unspecified: Secondary | ICD-10-CM | POA: Diagnosis not present

## 2023-07-30 DIAGNOSIS — D469 Myelodysplastic syndrome, unspecified: Secondary | ICD-10-CM | POA: Diagnosis not present

## 2023-08-13 DIAGNOSIS — D469 Myelodysplastic syndrome, unspecified: Secondary | ICD-10-CM | POA: Diagnosis not present

## 2023-08-19 DIAGNOSIS — D469 Myelodysplastic syndrome, unspecified: Secondary | ICD-10-CM | POA: Diagnosis not present

## 2023-08-20 DIAGNOSIS — M109 Gout, unspecified: Secondary | ICD-10-CM | POA: Diagnosis not present

## 2023-08-20 DIAGNOSIS — N184 Chronic kidney disease, stage 4 (severe): Secondary | ICD-10-CM | POA: Diagnosis not present

## 2023-08-20 DIAGNOSIS — I10 Essential (primary) hypertension: Secondary | ICD-10-CM | POA: Diagnosis not present

## 2023-08-20 DIAGNOSIS — E782 Mixed hyperlipidemia: Secondary | ICD-10-CM | POA: Diagnosis not present

## 2023-08-20 DIAGNOSIS — E1122 Type 2 diabetes mellitus with diabetic chronic kidney disease: Secondary | ICD-10-CM | POA: Diagnosis not present

## 2023-08-20 DIAGNOSIS — C029 Malignant neoplasm of tongue, unspecified: Secondary | ICD-10-CM | POA: Diagnosis not present

## 2023-09-05 DIAGNOSIS — N184 Chronic kidney disease, stage 4 (severe): Secondary | ICD-10-CM | POA: Diagnosis not present

## 2023-09-05 DIAGNOSIS — D469 Myelodysplastic syndrome, unspecified: Secondary | ICD-10-CM | POA: Diagnosis not present

## 2023-09-09 DIAGNOSIS — D649 Anemia, unspecified: Secondary | ICD-10-CM | POA: Diagnosis not present

## 2023-09-09 DIAGNOSIS — N183 Chronic kidney disease, stage 3 unspecified: Secondary | ICD-10-CM | POA: Diagnosis not present

## 2023-09-09 DIAGNOSIS — D469 Myelodysplastic syndrome, unspecified: Secondary | ICD-10-CM | POA: Diagnosis not present

## 2023-09-11 DIAGNOSIS — R809 Proteinuria, unspecified: Secondary | ICD-10-CM | POA: Diagnosis not present

## 2023-09-11 DIAGNOSIS — D631 Anemia in chronic kidney disease: Secondary | ICD-10-CM | POA: Diagnosis not present

## 2023-09-11 DIAGNOSIS — N189 Chronic kidney disease, unspecified: Secondary | ICD-10-CM | POA: Diagnosis not present

## 2023-09-11 DIAGNOSIS — E872 Acidosis, unspecified: Secondary | ICD-10-CM | POA: Diagnosis not present

## 2023-09-11 DIAGNOSIS — N2581 Secondary hyperparathyroidism of renal origin: Secondary | ICD-10-CM | POA: Diagnosis not present

## 2023-09-11 DIAGNOSIS — I129 Hypertensive chronic kidney disease with stage 1 through stage 4 chronic kidney disease, or unspecified chronic kidney disease: Secondary | ICD-10-CM | POA: Diagnosis not present

## 2023-09-11 DIAGNOSIS — N184 Chronic kidney disease, stage 4 (severe): Secondary | ICD-10-CM | POA: Diagnosis not present

## 2023-09-24 DIAGNOSIS — D469 Myelodysplastic syndrome, unspecified: Secondary | ICD-10-CM | POA: Diagnosis not present

## 2023-09-30 DIAGNOSIS — N184 Chronic kidney disease, stage 4 (severe): Secondary | ICD-10-CM | POA: Diagnosis not present

## 2023-09-30 DIAGNOSIS — D631 Anemia in chronic kidney disease: Secondary | ICD-10-CM | POA: Diagnosis not present

## 2023-09-30 DIAGNOSIS — D469 Myelodysplastic syndrome, unspecified: Secondary | ICD-10-CM | POA: Diagnosis not present

## 2023-09-30 DIAGNOSIS — N183 Chronic kidney disease, stage 3 unspecified: Secondary | ICD-10-CM | POA: Diagnosis not present

## 2023-10-17 DIAGNOSIS — N1831 Chronic kidney disease, stage 3a: Secondary | ICD-10-CM | POA: Diagnosis not present

## 2023-10-17 DIAGNOSIS — D469 Myelodysplastic syndrome, unspecified: Secondary | ICD-10-CM | POA: Diagnosis not present

## 2023-10-17 DIAGNOSIS — D649 Anemia, unspecified: Secondary | ICD-10-CM | POA: Diagnosis not present

## 2023-10-21 DIAGNOSIS — N1831 Chronic kidney disease, stage 3a: Secondary | ICD-10-CM | POA: Diagnosis not present

## 2023-10-21 DIAGNOSIS — E875 Hyperkalemia: Secondary | ICD-10-CM | POA: Diagnosis not present

## 2023-10-21 DIAGNOSIS — M109 Gout, unspecified: Secondary | ICD-10-CM | POA: Diagnosis not present

## 2023-10-21 DIAGNOSIS — D469 Myelodysplastic syndrome, unspecified: Secondary | ICD-10-CM | POA: Diagnosis not present

## 2023-10-21 DIAGNOSIS — D649 Anemia, unspecified: Secondary | ICD-10-CM | POA: Diagnosis not present

## 2023-10-21 DIAGNOSIS — N183 Chronic kidney disease, stage 3 unspecified: Secondary | ICD-10-CM | POA: Diagnosis not present

## 2023-11-04 DIAGNOSIS — N183 Chronic kidney disease, stage 3 unspecified: Secondary | ICD-10-CM | POA: Diagnosis not present

## 2023-11-04 DIAGNOSIS — D469 Myelodysplastic syndrome, unspecified: Secondary | ICD-10-CM | POA: Diagnosis not present

## 2023-11-04 DIAGNOSIS — M109 Gout, unspecified: Secondary | ICD-10-CM | POA: Diagnosis not present

## 2023-11-11 DIAGNOSIS — D469 Myelodysplastic syndrome, unspecified: Secondary | ICD-10-CM | POA: Diagnosis not present

## 2023-11-11 DIAGNOSIS — R5383 Other fatigue: Secondary | ICD-10-CM | POA: Diagnosis not present

## 2023-11-11 DIAGNOSIS — E875 Hyperkalemia: Secondary | ICD-10-CM | POA: Diagnosis not present

## 2023-11-22 DIAGNOSIS — N184 Chronic kidney disease, stage 4 (severe): Secondary | ICD-10-CM | POA: Diagnosis not present

## 2023-11-26 DIAGNOSIS — D469 Myelodysplastic syndrome, unspecified: Secondary | ICD-10-CM | POA: Diagnosis not present

## 2023-11-27 DIAGNOSIS — N189 Chronic kidney disease, unspecified: Secondary | ICD-10-CM | POA: Diagnosis not present

## 2023-11-27 DIAGNOSIS — D631 Anemia in chronic kidney disease: Secondary | ICD-10-CM | POA: Diagnosis not present

## 2023-11-27 DIAGNOSIS — N2581 Secondary hyperparathyroidism of renal origin: Secondary | ICD-10-CM | POA: Diagnosis not present

## 2023-11-27 DIAGNOSIS — N184 Chronic kidney disease, stage 4 (severe): Secondary | ICD-10-CM | POA: Diagnosis not present

## 2023-11-27 DIAGNOSIS — R809 Proteinuria, unspecified: Secondary | ICD-10-CM | POA: Diagnosis not present

## 2023-11-27 DIAGNOSIS — I129 Hypertensive chronic kidney disease with stage 1 through stage 4 chronic kidney disease, or unspecified chronic kidney disease: Secondary | ICD-10-CM | POA: Diagnosis not present

## 2023-11-27 DIAGNOSIS — E872 Acidosis, unspecified: Secondary | ICD-10-CM | POA: Diagnosis not present

## 2023-11-27 DIAGNOSIS — E1122 Type 2 diabetes mellitus with diabetic chronic kidney disease: Secondary | ICD-10-CM | POA: Diagnosis not present

## 2023-12-02 DIAGNOSIS — D469 Myelodysplastic syndrome, unspecified: Secondary | ICD-10-CM | POA: Diagnosis not present

## 2023-12-02 DIAGNOSIS — Z5111 Encounter for antineoplastic chemotherapy: Secondary | ICD-10-CM | POA: Diagnosis not present

## 2023-12-18 DIAGNOSIS — D469 Myelodysplastic syndrome, unspecified: Secondary | ICD-10-CM | POA: Diagnosis not present

## 2023-12-23 DIAGNOSIS — D469 Myelodysplastic syndrome, unspecified: Secondary | ICD-10-CM | POA: Diagnosis not present

## 2024-01-08 DIAGNOSIS — D469 Myelodysplastic syndrome, unspecified: Secondary | ICD-10-CM | POA: Diagnosis not present

## 2024-01-13 DIAGNOSIS — D469 Myelodysplastic syndrome, unspecified: Secondary | ICD-10-CM | POA: Diagnosis not present

## 2024-01-30 DIAGNOSIS — D469 Myelodysplastic syndrome, unspecified: Secondary | ICD-10-CM | POA: Diagnosis not present

## 2024-01-30 DIAGNOSIS — N184 Chronic kidney disease, stage 4 (severe): Secondary | ICD-10-CM | POA: Diagnosis not present

## 2024-02-03 DIAGNOSIS — E872 Acidosis, unspecified: Secondary | ICD-10-CM | POA: Diagnosis not present

## 2024-02-03 DIAGNOSIS — D469 Myelodysplastic syndrome, unspecified: Secondary | ICD-10-CM | POA: Diagnosis not present

## 2024-02-03 DIAGNOSIS — N2581 Secondary hyperparathyroidism of renal origin: Secondary | ICD-10-CM | POA: Diagnosis not present

## 2024-02-03 DIAGNOSIS — E1122 Type 2 diabetes mellitus with diabetic chronic kidney disease: Secondary | ICD-10-CM | POA: Diagnosis not present

## 2024-02-03 DIAGNOSIS — I129 Hypertensive chronic kidney disease with stage 1 through stage 4 chronic kidney disease, or unspecified chronic kidney disease: Secondary | ICD-10-CM | POA: Diagnosis not present

## 2024-02-03 DIAGNOSIS — R809 Proteinuria, unspecified: Secondary | ICD-10-CM | POA: Diagnosis not present

## 2024-02-03 DIAGNOSIS — D631 Anemia in chronic kidney disease: Secondary | ICD-10-CM | POA: Diagnosis not present

## 2024-02-03 DIAGNOSIS — N184 Chronic kidney disease, stage 4 (severe): Secondary | ICD-10-CM | POA: Diagnosis not present

## 2024-02-20 DIAGNOSIS — D469 Myelodysplastic syndrome, unspecified: Secondary | ICD-10-CM | POA: Diagnosis not present

## 2024-02-22 DIAGNOSIS — E782 Mixed hyperlipidemia: Secondary | ICD-10-CM | POA: Diagnosis not present

## 2024-02-22 DIAGNOSIS — N184 Chronic kidney disease, stage 4 (severe): Secondary | ICD-10-CM | POA: Diagnosis not present

## 2024-02-22 DIAGNOSIS — E1165 Type 2 diabetes mellitus with hyperglycemia: Secondary | ICD-10-CM | POA: Diagnosis not present

## 2024-02-24 ENCOUNTER — Other Ambulatory Visit: Payer: Self-pay

## 2024-02-24 DIAGNOSIS — N184 Chronic kidney disease, stage 4 (severe): Secondary | ICD-10-CM

## 2024-02-24 DIAGNOSIS — D469 Myelodysplastic syndrome, unspecified: Secondary | ICD-10-CM | POA: Diagnosis not present

## 2024-02-24 DIAGNOSIS — D649 Anemia, unspecified: Secondary | ICD-10-CM | POA: Diagnosis not present

## 2024-02-25 DIAGNOSIS — N183 Chronic kidney disease, stage 3 unspecified: Secondary | ICD-10-CM | POA: Diagnosis not present

## 2024-02-25 DIAGNOSIS — D6189 Other specified aplastic anemias and other bone marrow failure syndromes: Secondary | ICD-10-CM | POA: Diagnosis not present

## 2024-02-25 DIAGNOSIS — D469 Myelodysplastic syndrome, unspecified: Secondary | ICD-10-CM | POA: Diagnosis not present

## 2024-03-06 DIAGNOSIS — E559 Vitamin D deficiency, unspecified: Secondary | ICD-10-CM | POA: Diagnosis not present

## 2024-03-06 DIAGNOSIS — N184 Chronic kidney disease, stage 4 (severe): Secondary | ICD-10-CM | POA: Diagnosis not present

## 2024-03-09 DIAGNOSIS — I129 Hypertensive chronic kidney disease with stage 1 through stage 4 chronic kidney disease, or unspecified chronic kidney disease: Secondary | ICD-10-CM | POA: Diagnosis not present

## 2024-03-09 DIAGNOSIS — D631 Anemia in chronic kidney disease: Secondary | ICD-10-CM | POA: Diagnosis not present

## 2024-03-09 DIAGNOSIS — N2581 Secondary hyperparathyroidism of renal origin: Secondary | ICD-10-CM | POA: Diagnosis not present

## 2024-03-09 DIAGNOSIS — E1122 Type 2 diabetes mellitus with diabetic chronic kidney disease: Secondary | ICD-10-CM | POA: Diagnosis not present

## 2024-03-09 DIAGNOSIS — N184 Chronic kidney disease, stage 4 (severe): Secondary | ICD-10-CM | POA: Diagnosis not present

## 2024-03-12 ENCOUNTER — Ambulatory Visit: Admitting: Vascular Surgery

## 2024-03-12 ENCOUNTER — Ambulatory Visit (HOSPITAL_COMMUNITY)

## 2024-03-16 DIAGNOSIS — N183 Chronic kidney disease, stage 3 unspecified: Secondary | ICD-10-CM | POA: Diagnosis not present

## 2024-03-16 DIAGNOSIS — E875 Hyperkalemia: Secondary | ICD-10-CM | POA: Diagnosis not present

## 2024-03-16 DIAGNOSIS — D469 Myelodysplastic syndrome, unspecified: Secondary | ICD-10-CM | POA: Diagnosis not present

## 2024-03-16 DIAGNOSIS — Z79899 Other long term (current) drug therapy: Secondary | ICD-10-CM | POA: Diagnosis not present

## 2024-03-23 DIAGNOSIS — N184 Chronic kidney disease, stage 4 (severe): Secondary | ICD-10-CM | POA: Diagnosis not present

## 2024-03-23 DIAGNOSIS — E782 Mixed hyperlipidemia: Secondary | ICD-10-CM | POA: Diagnosis not present

## 2024-03-23 DIAGNOSIS — E1165 Type 2 diabetes mellitus with hyperglycemia: Secondary | ICD-10-CM | POA: Diagnosis not present

## 2024-04-01 DIAGNOSIS — E782 Mixed hyperlipidemia: Secondary | ICD-10-CM | POA: Diagnosis not present

## 2024-04-01 DIAGNOSIS — Z85810 Personal history of malignant neoplasm of tongue: Secondary | ICD-10-CM | POA: Diagnosis not present

## 2024-04-01 DIAGNOSIS — N184 Chronic kidney disease, stage 4 (severe): Secondary | ICD-10-CM | POA: Diagnosis not present

## 2024-04-01 DIAGNOSIS — N2581 Secondary hyperparathyroidism of renal origin: Secondary | ICD-10-CM | POA: Diagnosis not present

## 2024-04-01 DIAGNOSIS — E1122 Type 2 diabetes mellitus with diabetic chronic kidney disease: Secondary | ICD-10-CM | POA: Diagnosis not present

## 2024-04-01 DIAGNOSIS — I2510 Atherosclerotic heart disease of native coronary artery without angina pectoris: Secondary | ICD-10-CM | POA: Diagnosis not present

## 2024-04-01 DIAGNOSIS — I10 Essential (primary) hypertension: Secondary | ICD-10-CM | POA: Diagnosis not present

## 2024-04-01 DIAGNOSIS — D638 Anemia in other chronic diseases classified elsewhere: Secondary | ICD-10-CM | POA: Diagnosis not present

## 2024-04-01 DIAGNOSIS — Z0000 Encounter for general adult medical examination without abnormal findings: Secondary | ICD-10-CM | POA: Diagnosis not present

## 2024-04-06 DIAGNOSIS — D469 Myelodysplastic syndrome, unspecified: Secondary | ICD-10-CM | POA: Diagnosis not present

## 2024-04-17 DIAGNOSIS — D469 Myelodysplastic syndrome, unspecified: Secondary | ICD-10-CM | POA: Diagnosis not present

## 2024-04-17 DIAGNOSIS — D649 Anemia, unspecified: Secondary | ICD-10-CM | POA: Diagnosis not present

## 2024-04-21 ENCOUNTER — Ambulatory Visit (HOSPITAL_COMMUNITY)

## 2024-04-21 ENCOUNTER — Ambulatory Visit: Admitting: Vascular Surgery

## 2024-04-23 DIAGNOSIS — E1165 Type 2 diabetes mellitus with hyperglycemia: Secondary | ICD-10-CM | POA: Diagnosis not present

## 2024-04-23 DIAGNOSIS — N184 Chronic kidney disease, stage 4 (severe): Secondary | ICD-10-CM | POA: Diagnosis not present

## 2024-04-23 DIAGNOSIS — E782 Mixed hyperlipidemia: Secondary | ICD-10-CM | POA: Diagnosis not present

## 2024-04-27 DIAGNOSIS — N184 Chronic kidney disease, stage 4 (severe): Secondary | ICD-10-CM | POA: Diagnosis not present

## 2024-04-27 DIAGNOSIS — E875 Hyperkalemia: Secondary | ICD-10-CM | POA: Diagnosis not present

## 2024-04-27 DIAGNOSIS — D631 Anemia in chronic kidney disease: Secondary | ICD-10-CM | POA: Diagnosis not present

## 2024-04-27 DIAGNOSIS — D469 Myelodysplastic syndrome, unspecified: Secondary | ICD-10-CM | POA: Diagnosis not present

## 2024-05-04 ENCOUNTER — Other Ambulatory Visit (HOSPITAL_COMMUNITY): Payer: Self-pay | Admitting: Urology

## 2024-05-04 DIAGNOSIS — Z8546 Personal history of malignant neoplasm of prostate: Secondary | ICD-10-CM

## 2024-05-04 DIAGNOSIS — R9721 Rising PSA following treatment for malignant neoplasm of prostate: Secondary | ICD-10-CM

## 2024-05-13 DIAGNOSIS — D469 Myelodysplastic syndrome, unspecified: Secondary | ICD-10-CM | POA: Diagnosis not present

## 2024-05-18 DIAGNOSIS — N183 Chronic kidney disease, stage 3 unspecified: Secondary | ICD-10-CM | POA: Diagnosis not present

## 2024-05-18 DIAGNOSIS — I951 Orthostatic hypotension: Secondary | ICD-10-CM | POA: Diagnosis not present

## 2024-05-18 DIAGNOSIS — D469 Myelodysplastic syndrome, unspecified: Secondary | ICD-10-CM | POA: Diagnosis not present

## 2024-05-21 NOTE — Progress Notes (Unsigned)
 Patient ID: Kyle Rogers, male   DOB: 1951-06-08, 73 y.o.   MRN: [id number]  Reason for Consult: No chief complaint on file.   Referred by Dennise Hoes, MD  Subjective:     HPI Kyle Rogers is a 73 y.o. male who presents for evaluation HD access creation.  Past Medical History: No date: Anemia No date: CKD (chronic kidney disease), stage III (HCC) No date: Diabetes mellitus without complication (HCC) 03/19/2022: History of hepatitis C 03/19/2022: History of tongue cancer No date: Hyperlipidemia No date: Hypertension 04/25/2021: Malignant neoplasm of prostate (HCC) 03/19/2022: Thrombocytopenia (HCC)  No family history on file. Past Surgical History:  Procedure Laterality Date   CHOLECYSTECTOMY N/A 08/26/2022   Procedure: LAPAROSCOPIC CHOLECYSTECTOMY WITH  INTRAOPERATIVE CHOLANGIOGRAM;  Surgeon: Kinsinger, Herlene Righter, MD;  Location: [facility] OR;  Service: General;  Laterality: N/A;   ERCP N/A 08/25/2022   Procedure: ENDOSCOPIC RETROGRADE CHOLANGIOPANCREATOGRAPHY (ERCP);  Surgeon: Rollin Dover, MD;  Location: [facility] ENDOSCOPY;  Service: Gastroenterology;  Laterality: N/A;   PROSTATE BIOPSY     REMOVAL OF STONES  08/25/2022   Procedure: REMOVAL OF STONES;  Surgeon: Rollin Dover, MD;  Location: [facility] ENDOSCOPY;  Service: Gastroenterology;;   ANNETT  08/25/2022   Procedure: ANNETT;  Surgeon: Rollin Dover, MD;  Location: [facility] ENDOSCOPY;  Service: Gastroenterology;;   tongue surgery      Short Social History:  Social History   Tobacco Use   Smoking status: Never    Passive exposure: Never   Smokeless tobacco: Never  Substance Use Topics   Alcohol use: Not Currently    No Known Allergies  Current Outpatient Medications  Medication Sig Dispense Refill   allopurinol (ZYLOPRIM) 100 MG tablet Take 100 mg by mouth daily.     amLODipine (NORVASC) 5 MG tablet Take 5 mg by mouth daily. (Patient not taking: Reported on 08/27/2022)     esomeprazole (NEXIUM) 20 MG capsule  Take 20 mg by mouth daily at 12 noon.     glimepiride (AMARYL) 1 MG tablet Take 1 mg by mouth every morning. (Patient not taking: Reported on 08/27/2022)     JARDIANCE 25 MG TABS tablet Take 25 mg by mouth daily.     lisinopril (ZESTRIL) 40 MG tablet Take 40 mg by mouth daily.     metFORMIN (GLUCOPHAGE) 500 MG tablet Take 500 mg by mouth at bedtime.     metoprolol succinate (TOPROL-XL) 25 MG 24 hr tablet Take 25 mg by mouth every evening.     simvastatin (ZOCOR) 40 MG tablet Take 40 mg by mouth at bedtime.     sodium bicarbonate 650 MG tablet Take 650 mg by mouth 3 (three) times daily.     tadalafil (CIALIS) 20 MG tablet Take 20 mg by mouth daily as needed for erectile dysfunction.     traMADol  (ULTRAM ) 50 MG tablet Take 1 tablet (50 mg total) by mouth every 6 (six) hours as needed (breakthrough pain). 15 tablet 0   No current facility-administered medications for this visit.    REVIEW OF SYSTEMS All other systems were reviewed and are negative    Objective:  Objective   There were no vitals filed for this visit. There is no height or weight on file to calculate BMI.  Physical Exam General: no acute distress Cardiac: hemodynamically stable Pulm: normal work of breathing Neuro: alert, no focal deficit Extremities: *** Vascular:   Right: palpable brachial, radial  Left: palpable brachial, radial   Data: UE arterial duplex ***  Vein mapping ***  Note from nephrologist reviewed ***      Assessment/Plan:     Kyle Rogers is a 73 y.o. male with {CKDACcess:31998} who presents to discuss permanent access creation.  Dominant hand: {RIGHT/LEFT:20294} Previous access surgeries: *** Previous catheters: {RIGHT/LEFT:21944} IJ Currently dialyzing via *** on *** Other arm surgeries/injuries: ***  The vein mapping suggests there is *** a suitable *** and we discussed that they are a candidate for ***  The risks an benefits including of access creation were reviewed  including: need for additional procedures, need for additional creations, steal, ischemia monomelic neuropathy, failure of access, and bleeding. The patient expressed understand and is willing to proceed.  I explained that I will perform intraoperative vein mapping to confirm the above findings and will determine the most appropriate access to create but with a preoperative plan for {RIGHT/LEFT:20294} arm ***.     Norman GORMAN Serve MD [facility]

## 2024-05-22 ENCOUNTER — Ambulatory Visit (HOSPITAL_BASED_OUTPATIENT_CLINIC_OR_DEPARTMENT_OTHER)
Admission: RE | Admit: 2024-05-22 | Discharge: 2024-05-22 | Disposition: A | Discharge status: Home / Self Care | Source: Ambulatory Visit | Attending: Vascular Surgery | Admitting: Vascular Surgery

## 2024-05-22 ENCOUNTER — Ambulatory Visit (HOSPITAL_COMMUNITY)
Admission: RE | Admit: 2024-05-22 | Discharge: 2024-05-22 | Disposition: A | Discharge status: Home / Self Care | Source: Ambulatory Visit | Attending: Vascular Surgery | Admitting: Vascular Surgery

## 2024-05-22 ENCOUNTER — Encounter: Payer: Self-pay | Admitting: Vascular Surgery

## 2024-05-22 ENCOUNTER — Ambulatory Visit: Attending: Vascular Surgery | Admitting: Vascular Surgery

## 2024-05-22 VITALS — BP 153/77 | HR 44 | Temp 97.4°F | Ht 67.0 in | Wt 145.0 lb

## 2024-05-22 DIAGNOSIS — N184 Chronic kidney disease, stage 4 (severe): Secondary | ICD-10-CM | POA: Insufficient documentation

## 2024-05-22 DIAGNOSIS — N185 Chronic kidney disease, stage 5: Secondary | ICD-10-CM

## 2024-05-24 DIAGNOSIS — N184 Chronic kidney disease, stage 4 (severe): Secondary | ICD-10-CM | POA: Diagnosis not present

## 2024-05-24 DIAGNOSIS — E1165 Type 2 diabetes mellitus with hyperglycemia: Secondary | ICD-10-CM | POA: Diagnosis not present

## 2024-05-24 DIAGNOSIS — E782 Mixed hyperlipidemia: Secondary | ICD-10-CM | POA: Diagnosis not present

## 2024-05-29 ENCOUNTER — Encounter (HOSPITAL_COMMUNITY)
Admission: RE | Admit: 2024-05-29 | Discharge: 2024-05-29 | Disposition: A | Discharge status: Home / Self Care | Source: Ambulatory Visit | Attending: Urology | Admitting: Urology

## 2024-05-29 DIAGNOSIS — R9721 Rising PSA following treatment for malignant neoplasm of prostate: Secondary | ICD-10-CM | POA: Insufficient documentation

## 2024-05-29 DIAGNOSIS — Z8546 Personal history of malignant neoplasm of prostate: Secondary | ICD-10-CM | POA: Diagnosis present

## 2024-05-29 MED ORDER — FLOTUFOLASTAT F 18 GALLIUM 296-5846 MBQ/ML IV SOLN
8.3900 | Freq: Once | INTRAVENOUS | Status: AC
  Administered 2024-05-29: 8.39 via INTRAVENOUS

## 2024-06-04 DIAGNOSIS — N183 Chronic kidney disease, stage 3 unspecified: Secondary | ICD-10-CM | POA: Diagnosis not present

## 2024-06-08 DIAGNOSIS — D469 Myelodysplastic syndrome, unspecified: Secondary | ICD-10-CM | POA: Diagnosis not present

## 2024-06-08 DIAGNOSIS — D6189 Other specified aplastic anemias and other bone marrow failure syndromes: Secondary | ICD-10-CM | POA: Diagnosis not present

## 2024-06-08 DIAGNOSIS — Z85810 Personal history of malignant neoplasm of tongue: Secondary | ICD-10-CM | POA: Diagnosis not present

## 2024-06-08 DIAGNOSIS — C7951 Secondary malignant neoplasm of bone: Secondary | ICD-10-CM | POA: Diagnosis not present

## 2024-06-08 DIAGNOSIS — N183 Chronic kidney disease, stage 3 unspecified: Secondary | ICD-10-CM | POA: Diagnosis not present

## 2024-06-08 DIAGNOSIS — D630 Anemia in neoplastic disease: Secondary | ICD-10-CM | POA: Diagnosis not present

## 2024-06-08 DIAGNOSIS — Z79899 Other long term (current) drug therapy: Secondary | ICD-10-CM | POA: Diagnosis not present

## 2024-06-09 DIAGNOSIS — D6189 Other specified aplastic anemias and other bone marrow failure syndromes: Secondary | ICD-10-CM | POA: Diagnosis not present

## 2024-06-16 DIAGNOSIS — C7951 Secondary malignant neoplasm of bone: Secondary | ICD-10-CM | POA: Diagnosis not present

## 2024-06-16 DIAGNOSIS — N185 Chronic kidney disease, stage 5: Secondary | ICD-10-CM | POA: Diagnosis not present

## 2024-06-23 DIAGNOSIS — E782 Mixed hyperlipidemia: Secondary | ICD-10-CM | POA: Diagnosis not present

## 2024-06-23 DIAGNOSIS — N184 Chronic kidney disease, stage 4 (severe): Secondary | ICD-10-CM | POA: Diagnosis not present

## 2024-06-23 DIAGNOSIS — E1165 Type 2 diabetes mellitus with hyperglycemia: Secondary | ICD-10-CM | POA: Diagnosis not present

## 2024-06-24 DIAGNOSIS — D469 Myelodysplastic syndrome, unspecified: Secondary | ICD-10-CM | POA: Diagnosis not present

## 2024-06-24 DIAGNOSIS — N183 Chronic kidney disease, stage 3 unspecified: Secondary | ICD-10-CM | POA: Diagnosis not present

## 2024-06-24 DIAGNOSIS — D649 Anemia, unspecified: Secondary | ICD-10-CM | POA: Diagnosis not present

## 2024-06-29 DIAGNOSIS — D469 Myelodysplastic syndrome, unspecified: Secondary | ICD-10-CM | POA: Diagnosis not present

## 2024-07-08 DIAGNOSIS — D469 Myelodysplastic syndrome, unspecified: Secondary | ICD-10-CM | POA: Diagnosis not present

## 2024-07-10 DIAGNOSIS — H524 Presbyopia: Secondary | ICD-10-CM | POA: Diagnosis not present

## 2024-07-10 DIAGNOSIS — H25013 Cortical age-related cataract, bilateral: Secondary | ICD-10-CM | POA: Diagnosis not present

## 2024-07-20 DIAGNOSIS — D469 Myelodysplastic syndrome, unspecified: Secondary | ICD-10-CM | POA: Diagnosis not present
